# Patient Record
Sex: Female | Born: 1970 | Race: Black or African American | Hispanic: No | Marital: Single | Smoking: Never smoker
Health system: Southern US, Community
[De-identification: ages and names within clinical notes are randomized; demographics above are authoritative.]

## PROBLEM LIST (undated history)

## (undated) DIAGNOSIS — I10 Essential (primary) hypertension: Secondary | ICD-10-CM

## (undated) DIAGNOSIS — E785 Hyperlipidemia, unspecified: Secondary | ICD-10-CM

## (undated) DIAGNOSIS — E119 Type 2 diabetes mellitus without complications: Secondary | ICD-10-CM

## (undated) HISTORY — DX: Hyperlipidemia, unspecified: E78.5

## (undated) HISTORY — PX: APPENDECTOMY: SHX54

## (undated) HISTORY — DX: Essential (primary) hypertension: I10

## (undated) HISTORY — DX: Type 2 diabetes mellitus without complications: E11.9

---

## 1998-06-05 ENCOUNTER — Other Ambulatory Visit: Admission: RE | Admit: 1998-06-05 | Discharge: 1998-06-05 | Payer: Self-pay | Admitting: Obstetrics and Gynecology

## 1998-07-31 ENCOUNTER — Ambulatory Visit (HOSPITAL_COMMUNITY): Admission: RE | Admit: 1998-07-31 | Discharge: 1998-07-31 | Payer: Self-pay | Admitting: Obstetrics and Gynecology

## 1998-09-22 ENCOUNTER — Ambulatory Visit (HOSPITAL_COMMUNITY): Admission: RE | Admit: 1998-09-22 | Discharge: 1998-09-22 | Payer: Self-pay | Admitting: Obstetrics and Gynecology

## 1998-11-23 ENCOUNTER — Inpatient Hospital Stay (HOSPITAL_COMMUNITY): Admission: AD | Admit: 1998-11-23 | Discharge: 1998-11-23 | Payer: Self-pay | Admitting: Obstetrics and Gynecology

## 1998-11-29 ENCOUNTER — Inpatient Hospital Stay (HOSPITAL_COMMUNITY): Admission: AD | Admit: 1998-11-29 | Discharge: 1998-11-29 | Payer: Self-pay | Admitting: Obstetrics & Gynecology

## 1998-12-02 ENCOUNTER — Inpatient Hospital Stay (HOSPITAL_COMMUNITY): Admission: AD | Admit: 1998-12-02 | Discharge: 1998-12-04 | Payer: Self-pay | Admitting: Obstetrics and Gynecology

## 1998-12-30 ENCOUNTER — Other Ambulatory Visit: Admission: RE | Admit: 1998-12-30 | Discharge: 1998-12-30 | Payer: Self-pay | Admitting: Obstetrics & Gynecology

## 1999-02-26 ENCOUNTER — Ambulatory Visit (HOSPITAL_COMMUNITY): Admission: RE | Admit: 1999-02-26 | Discharge: 1999-02-26 | Payer: Self-pay | Admitting: Obstetrics & Gynecology

## 2002-11-07 ENCOUNTER — Other Ambulatory Visit: Admission: RE | Admit: 2002-11-07 | Discharge: 2002-11-07 | Payer: Self-pay | Admitting: Obstetrics & Gynecology

## 2006-07-26 ENCOUNTER — Encounter (INDEPENDENT_AMBULATORY_CARE_PROVIDER_SITE_OTHER): Payer: Self-pay | Admitting: *Deleted

## 2006-08-02 ENCOUNTER — Inpatient Hospital Stay (HOSPITAL_COMMUNITY): Admission: EM | Admit: 2006-08-02 | Discharge: 2006-08-09 | Payer: Self-pay | Admitting: Internal Medicine

## 2006-08-02 ENCOUNTER — Encounter: Payer: Self-pay | Admitting: Family Medicine

## 2006-08-16 ENCOUNTER — Ambulatory Visit (HOSPITAL_COMMUNITY): Admission: RE | Admit: 2006-08-16 | Discharge: 2006-08-16 | Payer: Self-pay | Admitting: Internal Medicine

## 2006-08-16 ENCOUNTER — Ambulatory Visit: Payer: Self-pay | Admitting: Family Medicine

## 2006-08-16 ENCOUNTER — Ambulatory Visit: Payer: Self-pay | Admitting: Internal Medicine

## 2006-08-16 ENCOUNTER — Inpatient Hospital Stay (HOSPITAL_COMMUNITY): Admission: EM | Admit: 2006-08-16 | Discharge: 2006-08-17 | Payer: Self-pay | Admitting: Emergency Medicine

## 2006-08-24 ENCOUNTER — Ambulatory Visit: Payer: Self-pay | Admitting: Family Medicine

## 2006-10-11 ENCOUNTER — Ambulatory Visit (HOSPITAL_COMMUNITY): Admission: RE | Admit: 2006-10-11 | Discharge: 2006-10-12 | Payer: Self-pay | Admitting: General Surgery

## 2007-02-22 DIAGNOSIS — E0590 Thyrotoxicosis, unspecified without thyrotoxic crisis or storm: Secondary | ICD-10-CM | POA: Insufficient documentation

## 2007-02-23 ENCOUNTER — Encounter (INDEPENDENT_AMBULATORY_CARE_PROVIDER_SITE_OTHER): Payer: Self-pay | Admitting: *Deleted

## 2007-02-28 IMAGING — CT CT PELVIS W/ CM
2 of 5 series · 17 of 46 positions shown, 19 images · IV contrast (omnipaque)
Comparison: CT 08/02/06.

CLINICAL DATA: Follow-up pelvic abscess.  
 ABDOMEN CT WITH CONTRAST:
TECHNIQUE: Multidetector CT imaging of the abdomen was performed following the standard protocol during bolus administration of intravenous contrast.
 Contrast:  100 cc Omnipaque 300.
TECHNIQUE: Multidetector CT imaging of the pelvis was performed following the standard protocol during bolus administration of intravenous contrast.

[Series 2: abd/pelv with 5.0 b31f st · axial · 0.80mm/px · z∈[-458,-58]mm · 14 of 92 slices shown, 16 images]
[im 6/92  soft-tissue]
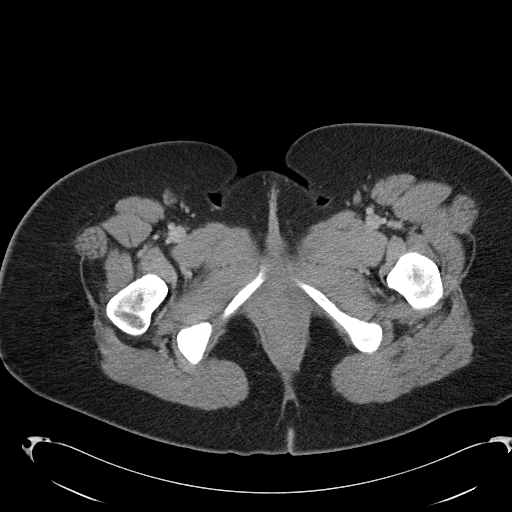
[im 6/92  bone]
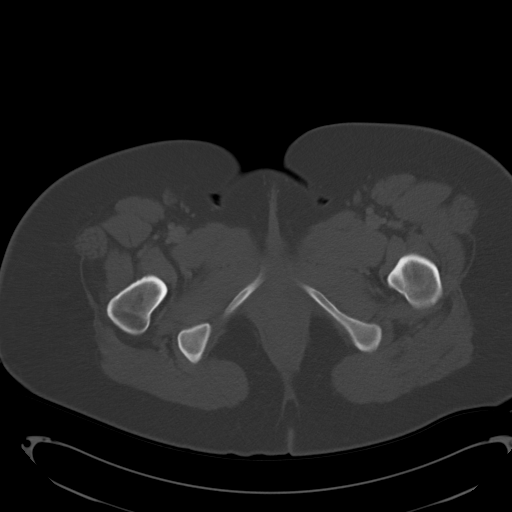
[im 11/92  soft-tissue]
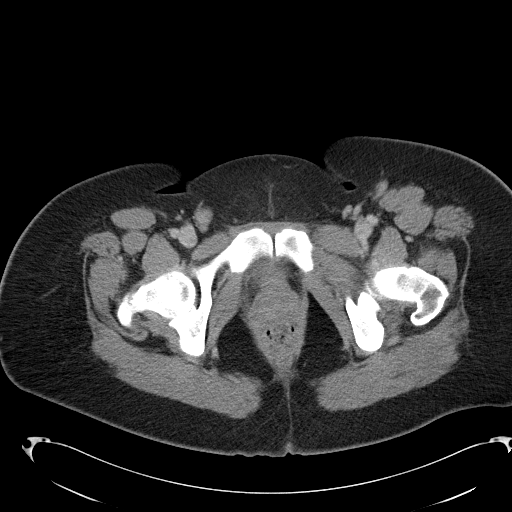
[im 21/92  soft-tissue]
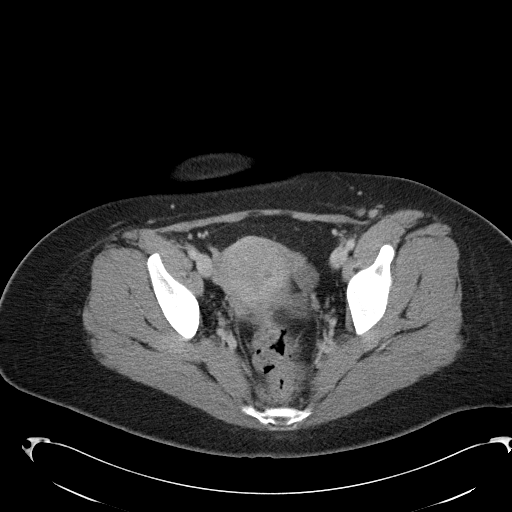
[im 26/92  soft-tissue]
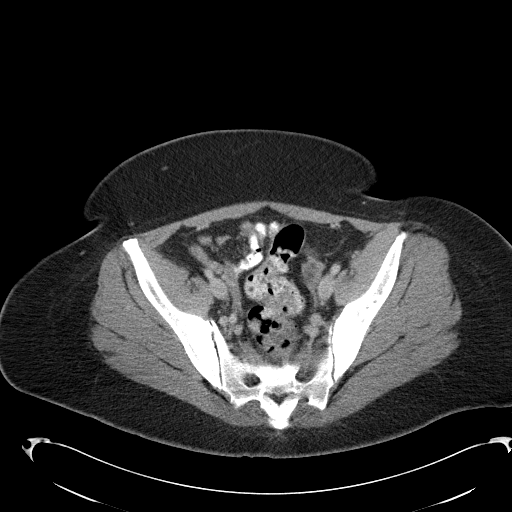
[im 31/92  soft-tissue]
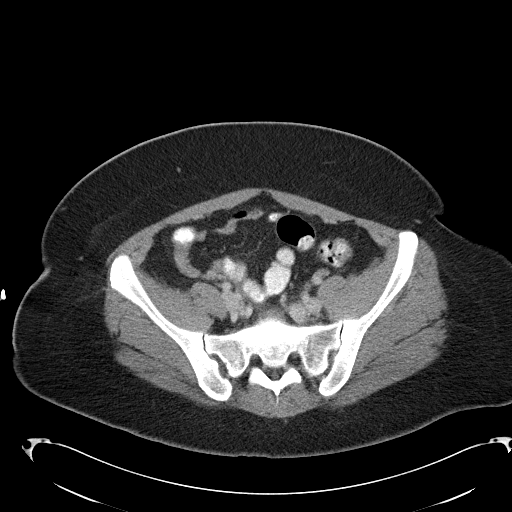
[im 36/92  soft-tissue]
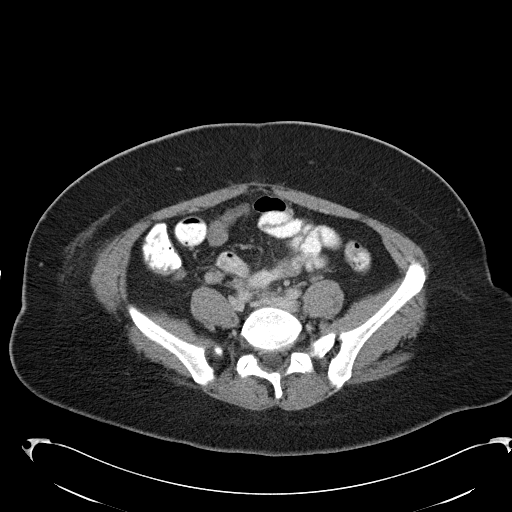
[im 41/92  soft-tissue]
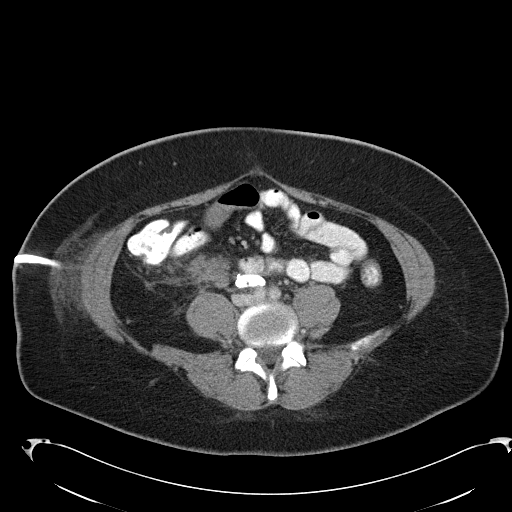
[im 51/92  soft-tissue]
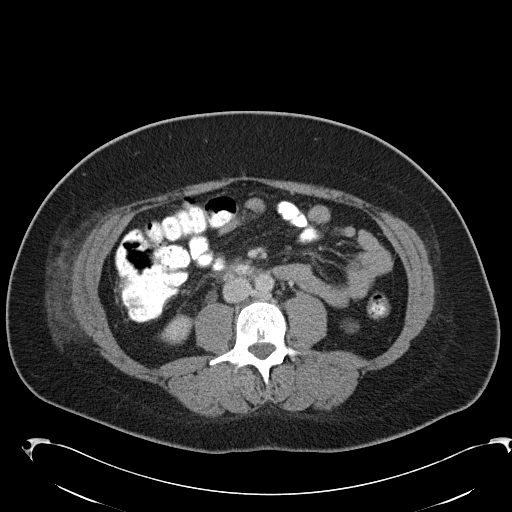
[im 56/92  soft-tissue]
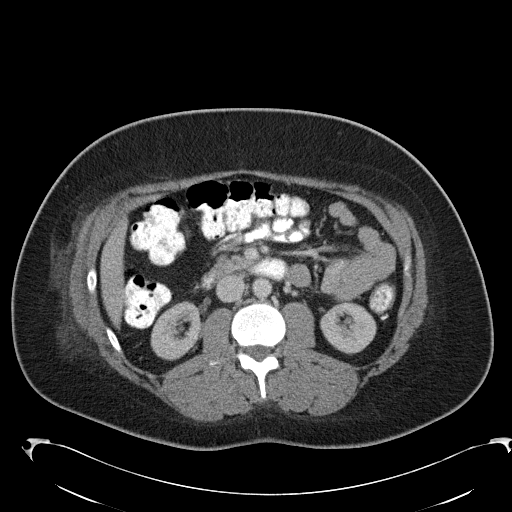
[im 56/92  bone]
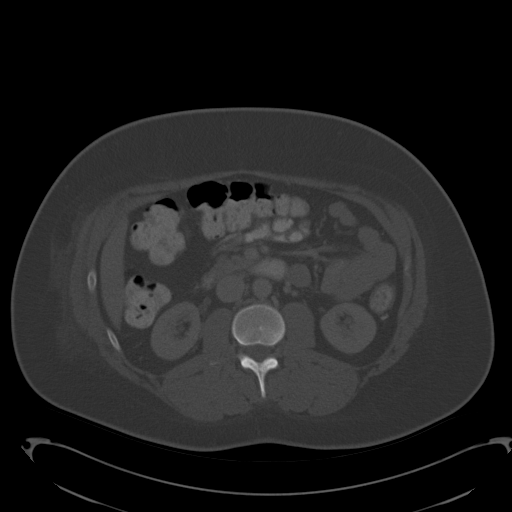
[im 61/92  soft-tissue]
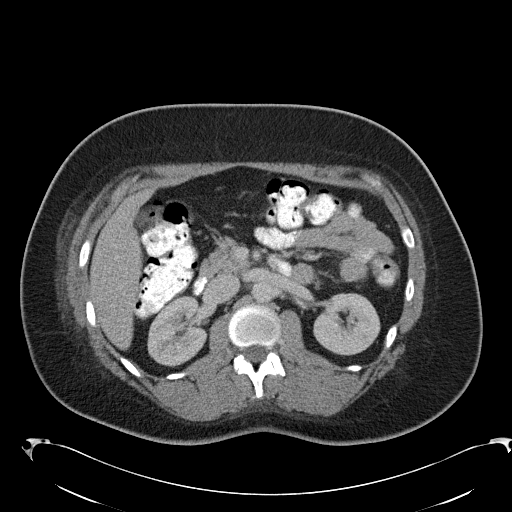
[im 66/92  soft-tissue]
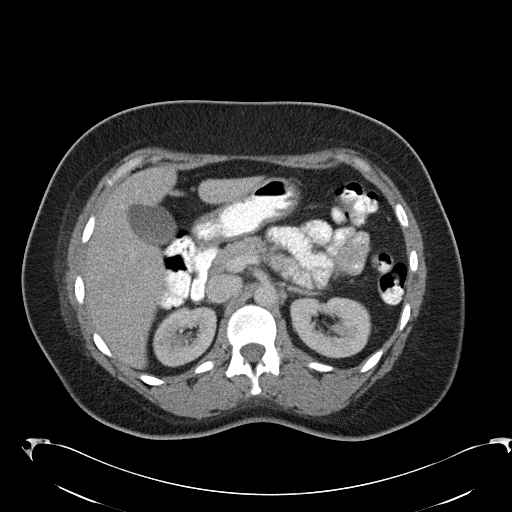
[im 71/92  soft-tissue]
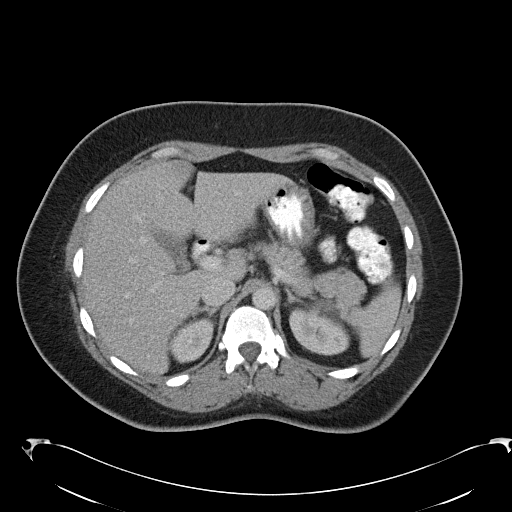
[im 81/92  soft-tissue]
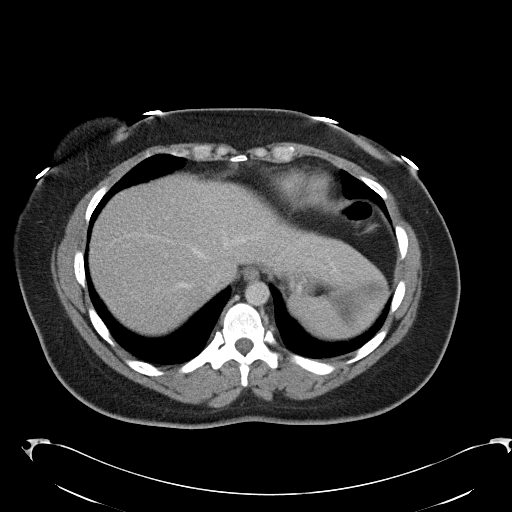
[im 86/92  soft-tissue]
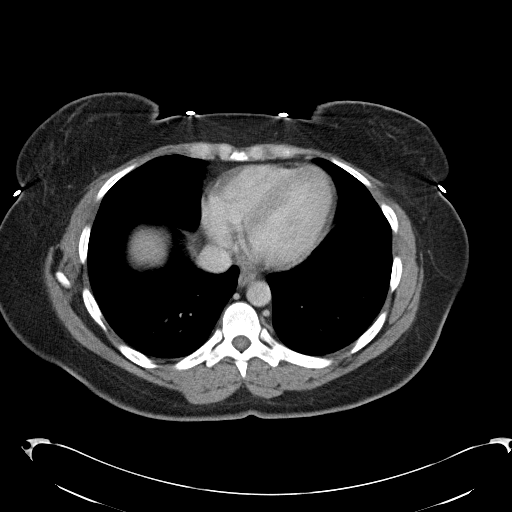

[Series 603: coronal · coronal · 0.89mm/px · 3 of 112 slices shown]
[im 38/112  soft-tissue]
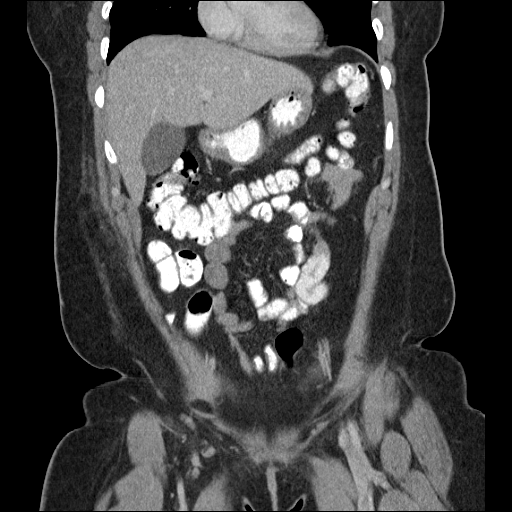
[im 50/112  soft-tissue]
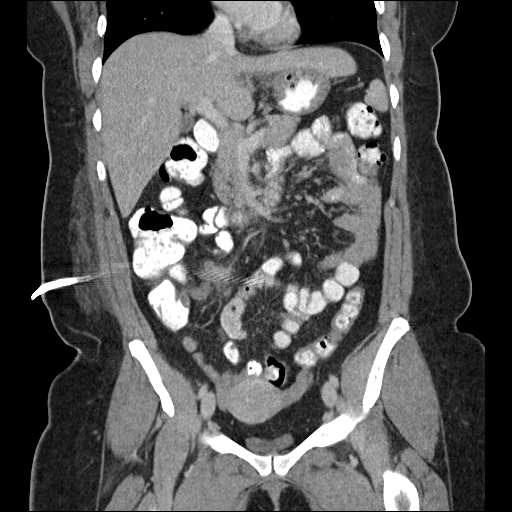
[im 62/112  soft-tissue]
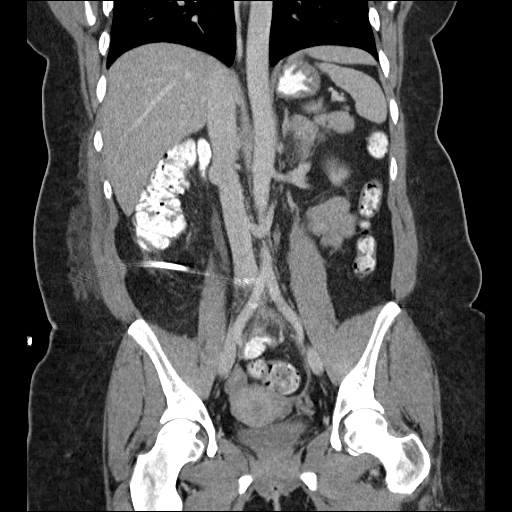

[17 of 46 positions shown; findings below may reference images not displayed]

FINDINGS: The liver, spleen, pancreas, and kidneys are normal.  The bowel is not dilated.  There is no free fluid.
IMPRESSION: Negative. 
 PELVIS CT WITH CONTRAST:
FINDINGS: There is a pigtail catheter drain in place from a right flank approach.  The tip is anterior to the distal aorta and proximal IVC in the retroperitoneum.  There is some inflammatory thickening around the catheter tubing with a soft tissue density measuring approximately 2 X 3 cm.  This is significantly improved from the pre-drainage CT when there was a 7 cm abscess.  The appendix is mildly thickened but does contain some air.  The appendix measures approximately 8 mm in diameter.  There is no free fluid.  There is a 15 mm cyst on the left ovary.  The bowel is nondilated.  There is mild diverticulosis in the left colon without acute inflammation of the colon.
IMPRESSION: Satisfactory catheter placement.  There is some soft tissue thickening around the catheter compatible with some residual inflammation without a definite drainable fluid collection.  The appendix is mildly thickened and there is some mildly periappendiceal edema.  
 Dr. Blain Jumper is attempting to reach Dr. Biffi regarding potential removal of the drainage catheter and surgical consultation for appendectomy.

## 2007-02-28 IMAGING — CR DG CHEST 1V PORT
1 series · 1 of 1 positions shown · non-contrast
Comparison: none

CLINICAL DATA: Syncope.
 PORTABLE CHEST ? 1 VIEW ? 08/16/06 ? 5835 HOURS:

[view not recorded]
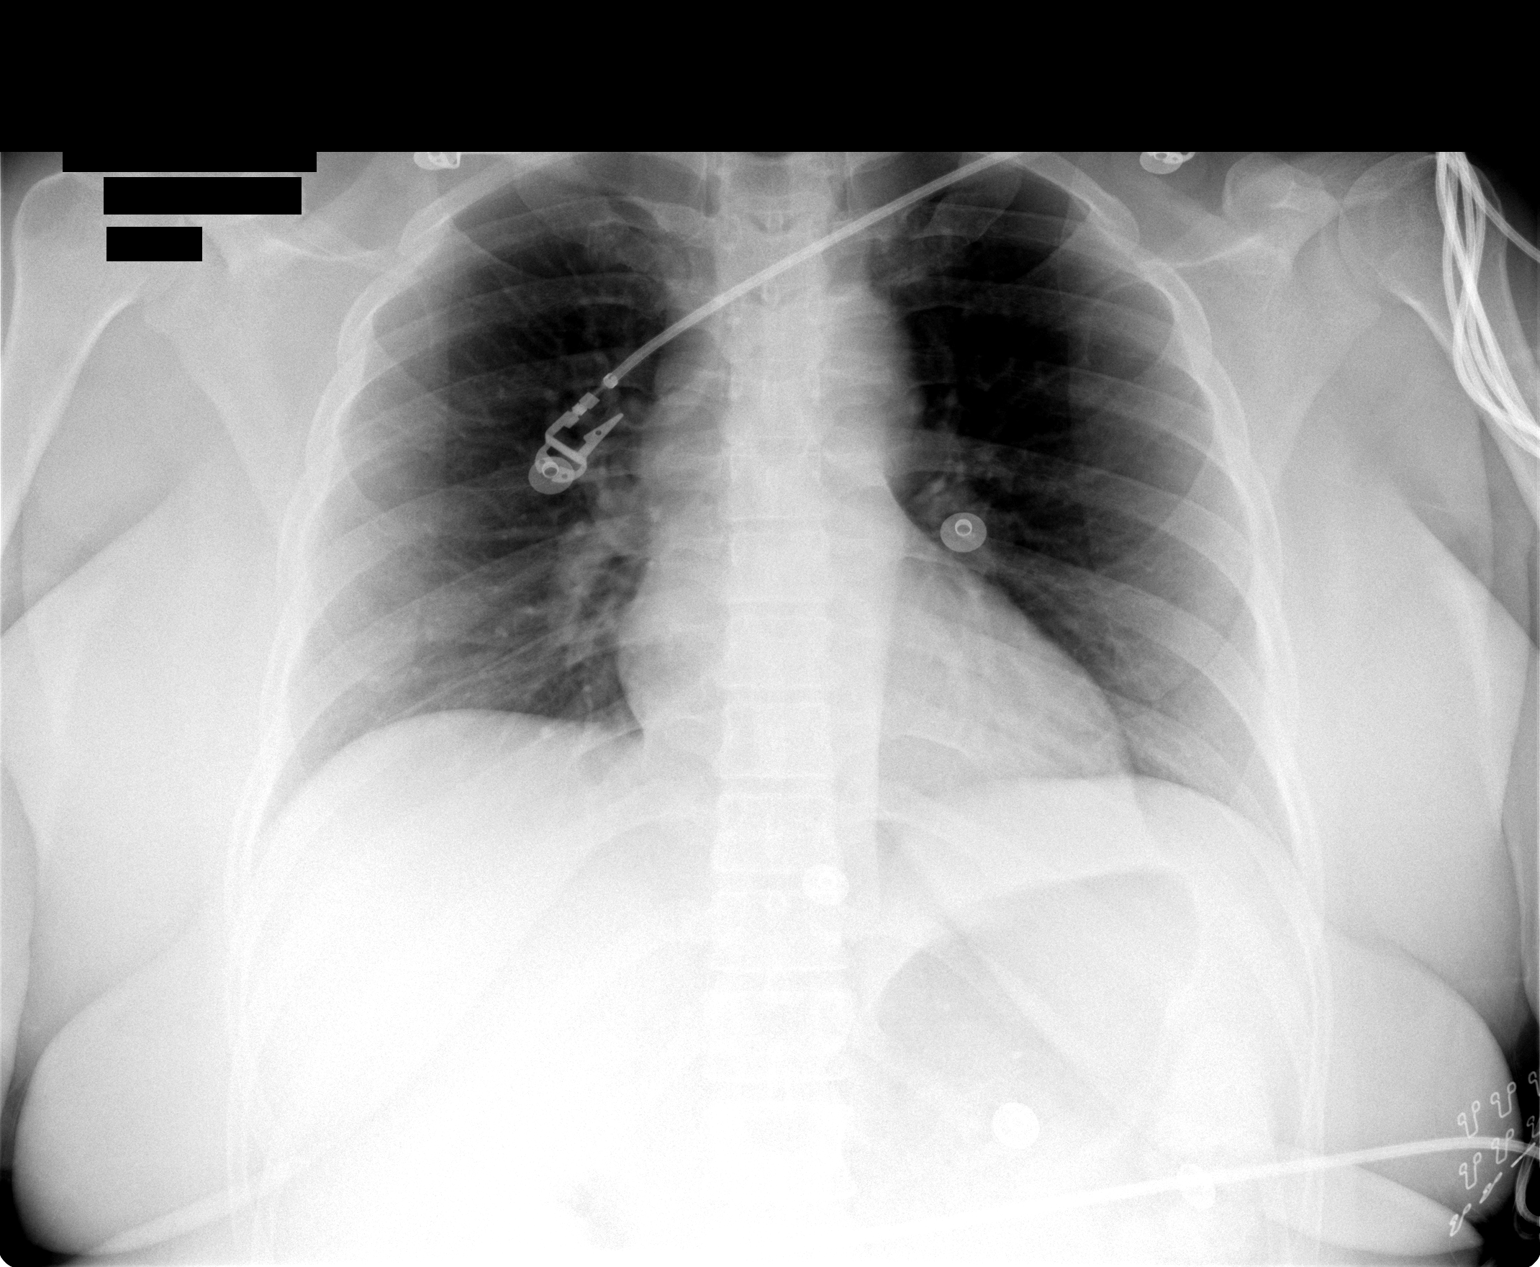

[1 of 1 positions shown; findings below may reference images not displayed]

FINDINGS: The lungs are clear.  The heart is within normal limits of size.  No bony abnormality is seen.
IMPRESSION: No active lung disease.

## 2011-01-16 ENCOUNTER — Encounter: Payer: Self-pay | Admitting: Internal Medicine

## 2013-03-20 ENCOUNTER — Ambulatory Visit (INDEPENDENT_AMBULATORY_CARE_PROVIDER_SITE_OTHER): Payer: BC Managed Care – PPO | Admitting: Family Medicine

## 2013-03-20 VITALS — BP 170/110 | HR 96 | Temp 98.0°F | Resp 18 | Ht 63.5 in | Wt 226.0 lb

## 2013-03-20 DIAGNOSIS — R030 Elevated blood-pressure reading, without diagnosis of hypertension: Secondary | ICD-10-CM

## 2013-03-20 DIAGNOSIS — J42 Unspecified chronic bronchitis: Secondary | ICD-10-CM

## 2013-03-20 DIAGNOSIS — IMO0001 Reserved for inherently not codable concepts without codable children: Secondary | ICD-10-CM

## 2013-03-20 MED ORDER — CEFTRIAXONE SODIUM 1 G IJ SOLR
1.0000 g | Freq: Once | INTRAMUSCULAR | Status: AC
  Administered 2013-03-20: 1 g via INTRAMUSCULAR

## 2013-03-20 MED ORDER — AZITHROMYCIN 500 MG PO TABS: 500.0000 mg | ORAL_TABLET | Freq: Every day | ORAL | Status: DC

## 2013-03-20 NOTE — Progress Notes (Signed)
SUBJECTIVE:  Kristine Mcdonald is a 42 y.o. female who complains of congestion, sneezing, nasal blockage, post nasal drip, dry cough and hoarseness for 21 days. She denies a history of chest pain, dizziness, fevers, myalgias, shortness of breath and weight loss and denies a history of asthma. Patient denies smoke cigarettes. 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    OBJECTIVE: Blood pressure 170/110, pulse 96, temperature 98 F (36.7 C), temperature source Oral, resp. rate 18, height 5' 3.5" (1.613 m), weight 226 lb (102.513 kg), last menstrual period 03/20/2013, SpO2 100.00%.  She appears well, vital signs are as noted. Ears normal.  Throat and pharynx mild erythema.  Neck supple. Mild adenopathy in the neck. Nose is congested. Sinuses non tender. The chest is clear, without wheezes or rales some upper airway transmitted noises.  ASSESSMENT:  Bronchitis  Elevated blood pressure.   PLAN: Symptomatic therapy suggested: push fluids, rest, gargle warm salt water and return office visit prn if symptoms persist or worsen. Ceftriaxone and azithro given today.. Call or return to clinic prn if these symptoms worsen or fail to improve as anticipated.  Discussed with patient at length about elevated blood pressure and how dangerous it can be.  Patient did take cold medication OTC that could be contributing.  Discussed for patient to go to drug store 2 times a week and check blood pressure keeping a diary and return in 2 weeks to recheck.

## 2013-03-20 NOTE — Patient Instructions (Signed)
Very nice to meet you.  I am giving you a medicine called azithromycin.  Take 1 pill daily for 3 days.  If not better in 1 week or get a fever please come back Come back in 2 weeks to have blood pressure check.   Bronchitis Bronchitis is the body's way of reacting to injury and/or infection (inflammation) of the bronchi. Bronchi are the air tubes that extend from the windpipe into the lungs. If the inflammation becomes severe, it may cause shortness of breath. CAUSES  Inflammation may be caused by:  A virus.  Germs (bacteria).  Dust.  Allergens.  Pollutants and many other irritants. The cells lining the bronchial tree are covered with tiny hairs (cilia). These constantly beat upward, away from the lungs, toward the mouth. This keeps the lungs free of pollutants. When these cells become too irritated and are unable to do their job, mucus begins to develop. This causes the characteristic cough of bronchitis. The cough clears the lungs when the cilia are unable to do their job. Without either of these protective mechanisms, the mucus would settle in the lungs. Then you would develop pneumonia. Smoking is a common cause of bronchitis and can contribute to pneumonia. Stopping this habit is the single most important thing you can do to help yourself. TREATMENT   Your caregiver may prescribe an antibiotic if the cough is caused by bacteria. Also, medicines that open up your airways make it easier to breathe. Your caregiver may also recommend or prescribe an expectorant. It will loosen the mucus to be coughed up. Only take over-the-counter or prescription medicines for pain, discomfort, or fever as directed by your caregiver.  Removing whatever causes the problem (smoking, for example) is critical to preventing the problem from getting worse.  Cough suppressants may be prescribed for relief of cough symptoms.  Inhaled medicines may be prescribed to help with symptoms now and to help prevent  problems from returning.  For those with recurrent (chronic) bronchitis, there may be a need for steroid medicines. SEEK IMMEDIATE MEDICAL CARE IF:   During treatment, you develop more pus-like mucus (purulent sputum).  You have a fever.  Your baby is older than 3 months with a rectal temperature of 102 F (38.9 C) or higher.  Your baby is 75 months old or younger with a rectal temperature of 100.4 F (38 C) or higher.  You become progressively more ill.  You have increased difficulty breathing, wheezing, or shortness of breath. It is necessary to seek immediate medical care if you are elderly or sick from any other disease. MAKE SURE YOU:   Understand these instructions.  Will watch your condition.  Will get help right away if you are not doing well or get worse. Document Released: 12/12/2005 Document Revised: 03/05/2012 Document Reviewed: 10/21/2008 [facility] Patient Information 2013 [place], [place].

## 2013-03-20 NOTE — Addendum Note (Signed)
Addended by: Judi Saa on: 03/20/2013 09:09 PM   Modules accepted: Orders

## 2013-04-10 ENCOUNTER — Ambulatory Visit (INDEPENDENT_AMBULATORY_CARE_PROVIDER_SITE_OTHER): Payer: BC Managed Care – PPO | Admitting: Emergency Medicine

## 2013-04-10 VITALS — BP 195/103 | HR 80 | Temp 99.3°F | Resp 18 | Wt 229.0 lb

## 2013-04-10 DIAGNOSIS — I10 Essential (primary) hypertension: Secondary | ICD-10-CM

## 2013-04-10 MED ORDER — HYDROCHLOROTHIAZIDE 25 MG PO TABS: 25.0000 mg | ORAL_TABLET | Freq: Every day | ORAL | Status: DC

## 2013-04-10 MED ORDER — LISINOPRIL 20 MG PO TABS: 20.0000 mg | ORAL_TABLET | Freq: Every day | ORAL | Status: DC

## 2013-04-10 NOTE — Patient Instructions (Addendum)

## 2013-04-10 NOTE — Progress Notes (Signed)
[facility] [address], [place] [address] [phone]  Date:  04/10/2013   Name:  Kristine Mcdonald   DOB:  09-Nov-1971   MRN:  [id number]  PCP:  Nilda Simmer, MD    Chief Complaint: Hypertension and Follow-up   History of Present Illness:  Kristine Mcdonald is a 42 y.o. very pleasant female patient who presents with the following:  Seen a couple weeks ago with URI and found to have elevated blood pressure that has been sustained with home readings during the interval.  Denies symptoms of end organ damage.  No history of hypertension and never treated.  Family history of hypertension.  No improvement with over the counter medications or other home remedies. Denies other complaint or health concern today.  Non smoker   Patient Active Problem List  Diagnosis  . HYPERTHYROIDISM, NOS    No past medical history on file.  Past Surgical History  Procedure Laterality Date  . Appendectomy      History  Substance Use Topics  . Smoking status: Never Smoker   . Smokeless tobacco: Not on file  . Alcohol Use: No    Family History  Problem Relation Age of Onset  . Hypertension Mother     Allergies  Allergen Reactions  . Iohexol      Code: HIVES, Desc: hives/rash/leg & arm pain a few hours after iv contrast administration- pt admitted- benadryl & solu-medrol given- 13-hr pre-meds required for future iv contrast administration, Onset Date: [id number]     Medication list has been reviewed and updated.  No current outpatient prescriptions on file prior to visit.   No current facility-administered medications on file prior to visit.    Review of Systems:  As per HPI, otherwise negative.    Physical Examination: Filed Vitals:   04/10/13 1902  BP: 195/103  Pulse: 80  Temp: 99.3 F (37.4 C)  Resp: 18   Filed Vitals:   04/10/13 1902  Weight: 229 lb (103.874 kg)   Body mass index is 39.92 kg/(m^2). Ideal Body Weight:    GEN: WDWN, NAD,  Non-toxic, A & O x 3 HEENT: Atraumatic, Normocephalic. Neck supple. No masses, No LAD. Ears and Nose: No external deformity. CV: RRR, No M/G/R. No JVD. No thrill. No extra heart sounds. PULM: CTA B, no wheezes, crackles, rhonchi. No retractions. No resp. distress. No accessory muscle use. ABD: S, NT, ND, +BS. No rebound. No HSM. EXTR: No c/c/e NEURO Normal gait.  PSYCH: Normally interactive. Conversant. Not depressed or anxious appearing.  Calm demeanor.    Assessment and Plan: Hypertension Lisinopril HCTZ Follow up in two weeks fasting in 104  Signed,  Phillips Odor, MD

## 2013-08-09 ENCOUNTER — Other Ambulatory Visit: Payer: Self-pay | Admitting: Emergency Medicine

## 2013-08-14 ENCOUNTER — Telehealth: Payer: Self-pay

## 2013-08-14 MED ORDER — HYDROCHLOROTHIAZIDE 25 MG PO TABS: 25.0000 mg | ORAL_TABLET | Freq: Every day | ORAL | Status: DC

## 2013-08-14 MED ORDER — LISINOPRIL 20 MG PO TABS: ORAL_TABLET | ORAL | Status: DC

## 2013-08-14 NOTE — Telephone Encounter (Signed)
30-day supply sent. Needs OV for additional refills.  Meds ordered this encounter  Medications  . hydrochlorothiazide (HYDRODIURIL) 25 MG tablet    Sig: Take 1 tablet (25 mg total) by mouth daily.    Dispense:  30 tablet    Refill:  0    Order Specific Question:  Supervising Provider    Answer:  DOOLITTLE, ROBERT P [[id number]]  . lisinopril (PRINIVIL,ZESTRIL) 20 MG tablet    Sig: TAKE 1 TABLET (20 MG TOTAL) BY MOUTH DAILY.    Dispense:  30 tablet    Refill:  0    Order Specific Question:  Supervising Provider    Answer:  Tonye Pearson [[id number]]  '

## 2013-08-14 NOTE — Telephone Encounter (Signed)
Patient is calling to get a refill on her blood pressure meds she know she needs an office visit but one like one more refill please call patient at [phone]

## 2013-08-14 NOTE — Telephone Encounter (Signed)
Called pt to advise Rx's sent in. LMOM to let her know.

## 2013-09-15 ENCOUNTER — Ambulatory Visit (INDEPENDENT_AMBULATORY_CARE_PROVIDER_SITE_OTHER): Payer: BC Managed Care – PPO | Admitting: Internal Medicine

## 2013-09-15 VITALS — BP 110/70 | HR 96 | Temp 98.0°F | Resp 16 | Ht 63.5 in | Wt 228.0 lb

## 2013-09-15 DIAGNOSIS — Z79899 Other long term (current) drug therapy: Secondary | ICD-10-CM

## 2013-09-15 DIAGNOSIS — I10 Essential (primary) hypertension: Secondary | ICD-10-CM

## 2013-09-15 DIAGNOSIS — Z7189 Other specified counseling: Secondary | ICD-10-CM

## 2013-09-15 LAB — POCT URINALYSIS DIPSTICK
Glucose, UA: NEGATIVE
Leukocytes, UA: NEGATIVE
Nitrite, UA: NEGATIVE
Protein, UA: NEGATIVE
Spec Grav, UA: 1.02
Urobilinogen, UA: 0.2

## 2013-09-15 LAB — POCT CBC
Granulocyte percent: 65.9 %G (ref 37–80)
MCH, POC: 24.4 pg — AB (ref 27–31.2)
MCV: 79 fL — AB (ref 80–97)
MID (cbc): 0.4 (ref 0–0.9)
MPV: 7.3 fL (ref 0–99.8)
POC LYMPH PERCENT: 28.8 %L (ref 10–50)
POC MID %: 5.3 %M (ref 0–12)
Platelet Count, POC: 498 10*3/uL — AB (ref 142–424)
RBC: 4.06 M/uL (ref 4.04–5.48)
RDW, POC: 15.5 %
WBC: 8.2 10*3/uL (ref 4.6–10.2)

## 2013-09-15 LAB — COMPREHENSIVE METABOLIC PANEL
Albumin: 3.9 g/dL (ref 3.5–5.2)
BUN: 17 mg/dL (ref 6–23)
CO2: 25 mEq/L (ref 19–32)
Calcium: 9.2 mg/dL (ref 8.4–10.5)
Chloride: 102 mEq/L (ref 96–112)
Creat: 1.46 mg/dL — ABNORMAL HIGH (ref 0.50–1.10)
Glucose, Bld: 81 mg/dL (ref 70–99)
Potassium: 4.4 mEq/L (ref 3.5–5.3)

## 2013-09-15 LAB — POCT UA - MICROSCOPIC ONLY
Bacteria, U Microscopic: NEGATIVE
Casts, Ur, LPF, POC: NEGATIVE
Crystals, Ur, HPF, POC: NEGATIVE

## 2013-09-15 LAB — LIPID PANEL
Cholesterol: 164 mg/dL (ref 0–200)
HDL: 45 mg/dL (ref >39)
Total CHOL/HDL Ratio: 3.6 Ratio

## 2013-09-15 MED ORDER — LISINOPRIL 20 MG PO TABS: ORAL_TABLET | ORAL | Status: DC

## 2013-09-15 NOTE — Progress Notes (Signed)
  Subjective:    Patient ID: Kristine Mcdonald, female    DOB: 1971/01/05, 42 y.o.   MRN: [id number]  HPI Pt presents for medication refills on Lisinopril and HCTZ. No problems and BP well controlled.  Review of Systems neg     Objective:   Physical Exam  Vitals reviewed. Constitutional: She is oriented to person, place, and time. She appears well-developed and well-nourished. No distress.  HENT:  Head: Normocephalic.  Eyes: EOM are normal.  Neck: Normal range of motion.  Cardiovascular: Normal rate, regular rhythm and normal heart sounds.   Pulmonary/Chest: Effort normal and breath sounds normal.  Musculoskeletal: Normal range of motion.  Neurological: She is alert and oriented to person, place, and time. She exhibits normal muscle tone. Coordination normal.  Psychiatric: She has a normal mood and affect.    labs Results for orders placed in visit on 09/15/13  POCT CBC      Result Value Range   WBC 8.2  4.6 - 10.2 K/uL   Lymph, poc 2.4  0.6 - 3.4   POC LYMPH PERCENT 28.8  10 - 50 %L   MID (cbc) 0.4  0 - 0.9   POC MID % 5.3  0 - 12 %M   POC Granulocyte 5.4  2 - 6.9   Granulocyte percent 65.9  37 - 80 %G   RBC 4.06  4.04 - 5.48 M/uL   Hemoglobin 9.9 (*) 12.2 - 16.2 g/dL   HCT, POC 62.1 (*) 30.8 - 47.9 %   MCV 79.0 (*) 80 - 97 fL   MCH, POC 24.4 (*) 27 - 31.2 pg   MCHC 30.8 (*) 31.8 - 35.4 g/dL   RDW, POC 65.7     Platelet Count, POC 498 (*) 142 - 424 K/uL   MPV 7.3  0 - 99.8 fL  POCT UA - MICROSCOPIC ONLY      Result Value Range   WBC, Ur, HPF, POC neg     RBC, urine, microscopic neg     Bacteria, U Microscopic neg     Mucus, UA neg     Epithelial cells, urine per micros TNTC     Crystals, Ur, HPF, POC neg     Casts, Ur, LPF, POC neg     Yeast, UA neg    POCT URINALYSIS DIPSTICK      Result Value Range   Color, UA yellow     Clarity, UA clear     Glucose, UA neg     Bilirubin, UA neg     Ketones, UA neg     Spec Grav, UA 1.020     Blood, UA neg     pH, UA  7.0     Protein, UA neg     Urobilinogen, UA 0.2     Nitrite, UA neg     Leukocytes, UA Negative          Assessment & Plan:  HTN controlled Anemia--take vitamin with iron

## 2013-09-15 NOTE — Patient Instructions (Addendum)
DASH Diet The DASH diet stands for "Dietary Approaches to Stop Hypertension." It is a healthy eating plan that has been shown to reduce high blood pressure (hypertension) in as little as 14 days, while also possibly providing other significant health benefits. These other health benefits include reducing the risk of breast cancer after menopause and reducing the risk of type 2 diabetes, heart disease, colon cancer, and stroke. Health benefits also include weight loss and slowing kidney failure in patients with chronic kidney disease.  DIET GUIDELINES  Limit salt (sodium). Your diet should contain less than 1500 mg of sodium daily.  Limit refined or processed carbohydrates. Your diet should include mostly whole grains. Desserts and added sugars should be used sparingly.  Include small amounts of heart-healthy fats. These types of fats include nuts, oils, and tub margarine. Limit saturated and trans fats. These fats have been shown to be harmful in the body. CHOOSING FOODS  The following food groups are based on a 2000 calorie diet. See your Registered Dietitian for individual calorie needs. Grains and Grain Products (6 to 8 servings daily)  Eat More Often: Whole-wheat bread, brown rice, whole-grain or wheat pasta, quinoa, popcorn without added fat or salt (air popped).  Eat Less Often: White bread, white pasta, white rice, cornbread. Vegetables (4 to 5 servings daily)  Eat More Often: Fresh, frozen, and canned vegetables. Vegetables may be raw, steamed, roasted, or grilled with a minimal amount of fat.  Eat Less Often/Avoid: Creamed or fried vegetables. Vegetables in a cheese sauce. Fruit (4 to 5 servings daily)  Eat More Often: All fresh, canned (in natural juice), or frozen fruits. Dried fruits without added sugar. One hundred percent fruit juice ( cup [237 mL] daily).  Eat Less Often: Dried fruits with added sugar. Canned fruit in light or heavy syrup. Lean Meats, Fish, and Poultry (2  servings or less daily. One serving is 3 to 4 oz [85-114 g]).  Eat More Often: Ninety percent or leaner ground beef, tenderloin, sirloin. Round cuts of beef, chicken breast, turkey breast. All fish. Grill, bake, or broil your meat. Nothing should be fried.  Eat Less Often/Avoid: Fatty cuts of meat, turkey, or chicken leg, thigh, or wing. Fried cuts of meat or fish. Dairy (2 to 3 servings)  Eat More Often: Low-fat or fat-free milk, low-fat plain or light yogurt, reduced-fat or part-skim cheese.  Eat Less Often/Avoid: Milk (whole, 2%).Whole milk yogurt. Full-fat cheeses. Nuts, Seeds, and Legumes (4 to 5 servings per week)  Eat More Often: All without added salt.  Eat Less Often/Avoid: Salted nuts and seeds, canned beans with added salt. Fats and Sweets (limited)  Eat More Often: Vegetable oils, tub margarines without trans fats, sugar-free gelatin. Mayonnaise and salad dressings.  Eat Less Often/Avoid: Coconut oils, palm oils, butter, stick margarine, cream, half and half, cookies, candy, pie. FOR MORE INFORMATION The Dash Diet Eating Plan: www.dashdiet.org Document Released: 12/01/2011 Document Revised: 03/05/2012 Document Reviewed: 12/01/2011 [employer] Patient Information 2014 [employer]. Calorie Counting Diet A calorie counting diet requires you to eat the number of calories that are right for you in a day. Calories are the measurement of how much energy you get from the food you eat. Eating the right amount of calories is important for staying at a healthy weight. If you eat too many calories, your body will store them as fat and you may gain weight. If you eat too few calories, you may lose weight. Counting the number of calories you eat during   a day will help you know if you are eating the right amount. A Registered Dietitian can determine how many calories you need in a day. The amount of calories needed varies from person to person. If your goal is to lose weight, you will need  to eat fewer calories. Losing weight can benefit you if you are overweight or have health problems such as heart disease, high blood pressure, or diabetes. If your goal is to gain weight, you will need to eat more calories. Gaining weight may be necessary if you have a certain health problem that causes your body to need more energy. TIPS Whether you are increasing or decreasing the number of calories you eat during a day, it may be hard to get used to changes in what you eat and drink. The following are tips to help you keep track of the number of calories you eat.  Measure foods at home with measuring cups. This helps you know the amount of food and number of calories you are eating.  Restaurants often serve food in amounts that are larger than 1 serving. While eating out, estimate how many servings of a food you are given. For example, a serving of cooked rice is  cup or about the size of half of a fist. Knowing serving sizes will help you be aware of how much food you are eating at restaurants.  Ask for smaller portion sizes or child-size portions at restaurants.  Plan to eat half of a meal at a restaurant. Take the rest home or share the other half with a friend.  Read the Nutrition Facts panel on food labels for calorie content and serving size. You can find out how many servings are in a package, the size of a serving, and the number of calories each serving has.  For example, a package might contain 3 cookies. The Nutrition Facts panel on that package says that 1 serving is 1 cookie. Below that, it will say there are 3 servings in the container. The calories section of the Nutrition Facts label says there are 90 calories. This means there are 90 calories in 1 cookie (1 serving). If you eat 1 cookie you have eaten 90 calories. If you eat all 3 cookies, you have eaten 270 calories (3 servings x 90 calories = 270 calories). The list below tells you how big or small some common portion sizes  are.  1 oz.........4 stacked dice.  3 oz.........Deck of cards.  1 tsp........Tip of little finger.  1 tbs........Thumb.  2 tbs........Golf ball.   cup.......Half of a fist.  1 cup........A fist. KEEP A FOOD LOG Write down every food item you eat, the amount you eat, and the number of calories in each food you eat during the day. At the end of the day, you can add up the total number of calories you have eaten. It may help to keep a list like the one below. Find out the calorie information by reading the Nutrition Facts panel on food labels. Breakfast  Bran cereal (1 cup, 110 calories).  Fat-free milk ( cup, 45 calories). Snack  Apple (1 medium, 80 calories). Lunch  Spinach (1 cup, 20 calories).  Tomato ( medium, 20 calories).  Chicken breast strips (3 oz, 165 calories).  Shredded cheddar cheese ( cup, 110 calories).  Light Italian dressing (2 tbs, 60 calories).  Whole-wheat bread (1 slice, 80 calories).  Tub margarine (1 tsp, 35 calories).  Vegetable soup (1 cup, 160 calories). Dinner    Pork chop (3 oz, 190 calories).  Brown rice (1 cup, 215 calories).  Steamed broccoli ( cup, 20 calories).  Strawberries (1  cup, 65 calories).  Whipped cream (1 tbs, 50 calories). Daily Calorie Total: 1425 Document Released: 12/12/2005 Document Revised: 03/05/2012 Document Reviewed: 06/08/2007 [employer] Patient Information 2014 [employer].  

## 2013-09-15 NOTE — Progress Notes (Signed)
  Subjective:    Patient ID: Kristine Mcdonald, female    DOB: 1971/11/04, 42 y.o.   MRN: [id number]  HPI    Review of Systems     Objective:   Physical Exam        Assessment & Plan:

## 2013-09-16 ENCOUNTER — Telehealth: Payer: Self-pay

## 2013-09-16 NOTE — Telephone Encounter (Signed)
PT would like for someone to call her back about labs she missed a call earlier about them Call back number is [phone]

## 2013-09-17 ENCOUNTER — Telehealth: Payer: Self-pay

## 2013-09-17 NOTE — Telephone Encounter (Signed)
See labs, they have called again

## 2013-09-17 NOTE — Telephone Encounter (Signed)
Pt.notified

## 2013-09-17 NOTE — Telephone Encounter (Signed)
Pt was returning our call in regards to her labs.  [phone]

## 2014-05-25 ENCOUNTER — Other Ambulatory Visit: Payer: Self-pay | Admitting: Emergency Medicine

## 2014-07-14 ENCOUNTER — Other Ambulatory Visit: Payer: Self-pay | Admitting: Internal Medicine

## 2014-08-02 ENCOUNTER — Other Ambulatory Visit: Payer: Self-pay | Admitting: Internal Medicine

## 2014-08-02 NOTE — Telephone Encounter (Signed)
Pt has only been taking 1/2 tab qd and so hasn't needed RF's. She is coming in for a CPE after 9/21. Sent in a 2 month supply until she could get in. Pt agreeable.

## 2014-08-02 NOTE — Telephone Encounter (Signed)
Last CPE was on 09/15/13, last med RF was on 05/25/14. The request from 07/14/14 was denied. Called pt to check on f/u status.

## 2014-09-30 ENCOUNTER — Ambulatory Visit (INDEPENDENT_AMBULATORY_CARE_PROVIDER_SITE_OTHER): Payer: BC Managed Care – PPO | Admitting: Family Medicine

## 2014-09-30 VITALS — BP 110/80 | HR 94 | Temp 98.8°F | Resp 16 | Ht 64.0 in | Wt 235.0 lb

## 2014-09-30 DIAGNOSIS — I10 Essential (primary) hypertension: Secondary | ICD-10-CM

## 2014-09-30 DIAGNOSIS — Z23 Encounter for immunization: Secondary | ICD-10-CM

## 2014-09-30 MED ORDER — LISINOPRIL-HYDROCHLOROTHIAZIDE 20-12.5 MG PO TABS: 1.0000 | ORAL_TABLET | Freq: Every day | ORAL | Status: DC

## 2014-09-30 NOTE — Progress Notes (Signed)
Subjective: Patient feels fairly well. She had a cold about a month ago and is gradually getting over the cough. Even prior to being on blood pressure medicine she tends to have a little hacking cough. We talked about blood pressure medicine induced cough from the lisinopril. If she gets worse she will let me know, or if it is not improving. No major complaints today. She is trying to work on getting exercise and modifying eating behavior. We had a long talk about that.  Objective: Chest is clear. Heart regular without murmurs. No thyromegaly. Blood pressure is noted.  Assessment: Hypertension, controlled Overweight  Plan: Same medication. Return in 3-4 months. Be faithful in working hard on weight loss, especially in avoiding soft drinks and eating less

## 2014-09-30 NOTE — Patient Instructions (Addendum)
Continue current medication  Work hard on continuing to try and eat and drink less in order to lose weight. Continue size.  Return in about February for a follow-up.

## 2015-10-01 ENCOUNTER — Other Ambulatory Visit: Payer: Self-pay | Admitting: Family Medicine

## 2015-10-12 ENCOUNTER — Ambulatory Visit (INDEPENDENT_AMBULATORY_CARE_PROVIDER_SITE_OTHER): Payer: BC Managed Care – PPO | Admitting: Family Medicine

## 2015-10-12 VITALS — BP 118/72 | HR 96 | Temp 98.4°F | Resp 18 | Ht 64.5 in | Wt 233.0 lb

## 2015-10-12 DIAGNOSIS — S46911A Strain of unspecified muscle, fascia and tendon at shoulder and upper arm level, right arm, initial encounter: Secondary | ICD-10-CM

## 2015-10-12 DIAGNOSIS — I10 Essential (primary) hypertension: Secondary | ICD-10-CM

## 2015-10-12 DIAGNOSIS — H109 Unspecified conjunctivitis: Secondary | ICD-10-CM | POA: Diagnosis not present

## 2015-10-12 MED ORDER — OFLOXACIN 0.3 % OP SOLN: 1.0000 [drp] | Freq: Four times a day (QID) | OPHTHALMIC | Status: DC

## 2015-10-12 MED ORDER — METHOCARBAMOL 750 MG PO TABS: 750.0000 mg | ORAL_TABLET | Freq: Three times a day (TID) | ORAL | Status: DC

## 2015-10-12 MED ORDER — LISINOPRIL-HYDROCHLOROTHIAZIDE 20-12.5 MG PO TABS: ORAL_TABLET | ORAL | Status: DC

## 2015-10-12 NOTE — Patient Instructions (Signed)
Take Aleve (naproxen) 220 mg 2 pills twice daily with food  Take the methocarbamol, muscle relaxant, 750 mg one at bedtime. If worse can take 13 times daily if needed  Use the ofloxacin eyedrops 1 or 2 drops in left eye 4 times daily  Continue taking your lisinopril HCT blood pressure medication one daily  Return if further problems  Return 6 months for blood pressure recheck and labs

## 2015-10-12 NOTE — Progress Notes (Signed)
Patient ID: Kristine Mcdonald, female    DOB: 15-Sep-1971  Age: 44 y.o. MRN: [id number]  Chief Complaint  Patient presents with  . Conjunctivitis    eye redness this morning-left   . Shoulder Pain    pulled muscle last week pain moving down rt arm     Subjective:   44 year old lady who is here for a couple of things. This morning she awakened with a man and left eye which was reddened. She does not know of any foreign body and has not had any foreign body sensation. She has not been immediately expose, she is a Chartered loss adjusterschoolteacher. However she was off last week and a conference. She has had a tiny bit of sneezing but no major upper respiratory symptoms.  She also has been hurting her right shoulder for a week or so. She had been to a conference and carries a bag with her computer and supplies and it. She knows of no specific injury. She does do walking for exercise but does not do any upper body exercise particularly. Her shoulder has felt better the last couple days that she's got home. She has taken a few Tylenol, nothing else much.  She continues to take her blood pressure medication on a daily basis. She monitors her blood pressure at home and it is been good. She's not any chest pain or symptoms of her heart. No GI complaints.    Current allergies, medications, problem list, past/family and social histories reviewed.  Objective:  BP 118/72 mmHg  Pulse 96  Temp(Src) 98.4 F (36.9 C) (Oral)  Resp 18  Ht 5' 4.5" (1.638 m)  Wt 233 lb (105.688 kg)  BMI 39.39 kg/m2  SpO2 98%  LMP 10/09/2015  Healthy-appearing lady, overweight but she is working on that now. Her TMs are normal. Eyes PERRLA. Fundi benign with discs flat. Left conjunctiva slightly moist and injected. The throat was clear. TMs normal. Neck supple without significant nodes. Chest is clear to auscultation. Heart regular without murmur. Right shoulder has a pain patch on it right now. It's not particularly tender except for very  mild tenderness below the patch in the right upper scapular region. She has good range of motion. Mild pain when going against resistance. Otherwise it is nonpainful now. Grip is good and her hand.  Assessment & Plan:   Assessment: 1. Essential hypertension   2. Conjunctivitis, left eye   3. Right shoulder strain, initial encounter       Plan: No labs or x-rays will be done today. She is to continue taking her same blood pressure medication, he is ofloxacin eyedrops, and some Robaxin for her shoulder low taking OTC naproxen    Meds ordered this encounter  Medications  . ferrous sulfate 325 (65 FE) MG tablet    Sig: Take 325 mg by mouth daily with breakfast.  . lisinopril-hydrochlorothiazide (PRINZIDE,ZESTORETIC) 20-12.5 MG tablet    Sig: TAKE 1 TABLET BY MOUTH DAILY    Dispense:  90 tablet    Refill:  1  . ofloxacin (OCUFLOX) 0.3 % ophthalmic solution    Sig: Place 1 drop into the left eye 4 (four) times daily.    Dispense:  5 mL    Refill:  0  . methocarbamol (ROBAXIN) 750 MG tablet    Sig: Take 1 tablet (750 mg total) by mouth 3 (three) times daily.    Dispense:  20 tablet    Refill:  0         Patient  Instructions  Take Aleve (naproxen) 220 mg 2 pills twice daily with food  Take the methocarbamol, muscle relaxant, 750 mg one at bedtime. If worse can take 13 times daily if needed  Use the ofloxacin eyedrops 1 or 2 drops in left eye 4 times daily  Continue taking your lisinopril HCT blood pressure medication one daily  Return if further problems  Return 6 months for blood pressure recheck and labs     Return in about 6 months (around 04/11/2016).   HOPPER,DAVID, MD 10/12/2015

## 2016-05-10 ENCOUNTER — Other Ambulatory Visit: Payer: Self-pay | Admitting: Family Medicine

## 2016-11-04 ENCOUNTER — Other Ambulatory Visit: Payer: Self-pay | Admitting: Family Medicine

## 2016-12-09 ENCOUNTER — Ambulatory Visit (INDEPENDENT_AMBULATORY_CARE_PROVIDER_SITE_OTHER): Payer: BC Managed Care – PPO | Admitting: Physician Assistant

## 2016-12-09 VITALS — BP 130/80 | HR 93 | Temp 98.6°F | Resp 17 | Ht 64.5 in | Wt 245.0 lb

## 2016-12-09 DIAGNOSIS — H6982 Other specified disorders of Eustachian tube, left ear: Secondary | ICD-10-CM

## 2016-12-09 NOTE — Patient Instructions (Signed)
     IF you received an x-ray today, you will receive an invoice from [facility]. Please contact [facility] at [phone] with questions or concerns regarding your invoice.   IF you received labwork today, you will receive an invoice from [facility]. Please contact [facility] at [phone] with questions or concerns regarding your invoice.   Our billing staff will not be able to assist you with questions regarding bills from these companies.  You will be contacted with the lab results as soon as they are available. The fastest way to get your results is to activate your My Chart account. Instructions are located on the last page of this paperwork. If you have not heard from us regarding the results in 2 weeks, please contact this office.     

## 2016-12-09 NOTE — Progress Notes (Signed)
   12/09/2016 6:52 PM   DOB: 15-Jan-1971 / MRN: [id number]  SUBJECTIVE:  Kristine Mcdonald is a 45 y.o. female presenting for ears feeling clogged for the last three days.  This did seem to get better with popping her ears this morning.  She had a cold last week.  She denies any ear pain today.  She does associate a change in hearing.   She is allergic to iohexol.   She  has a past medical history of Hypertension.    She  reports that she has never smoked. She has never used smokeless tobacco. She reports that she does not drink alcohol or use drugs. She  reports that she does not engage in sexual activity. The patient  has a past surgical history that includes Appendectomy.  Her family history includes Hypertension in her mother.  Review of Systems  Constitutional: Negative for fever.  HENT: Positive for congestion and hearing loss. Negative for ear discharge, ear pain, sinus pain, sore throat and tinnitus.   Skin: Negative for rash.  Neurological: Negative for dizziness and headaches.    The problem list and medications were reviewed and updated by myself where necessary and exist elsewhere in the encounter.   OBJECTIVE:  BP 130/80 (BP Location: Right Arm, Patient Position: Sitting, Cuff Size: Large)   Pulse 93   Temp 98.6 F (37 C) (Oral)   Resp 17   Ht 5' 4.5" (1.638 m)   Wt 245 lb (111.1 kg)   SpO2 100%   BMI 41.40 kg/m   Physical Exam  HENT:  Right Ear: Tympanic membrane normal.  Left Ear: Tympanic membrane is retracted. Tympanic membrane is not erythematous.  Nose: Mucosal edema present.  Mouth/Throat: Uvula is midline, oropharynx is clear and moist and mucous membranes are normal.  Cardiovascular: Normal rate.   Pulmonary/Chest: Effort normal.    No results found for this or any previous visit (from the past 72 hour(s)).  No results found.  ASSESSMENT AND PLAN  Kristine Mcdonald was seen today for ear pain.  Diagnoses and all orders for this visit:  ETD (Eustachian  tube dysfunction), left: Improving.  Advised Afrin only as needed and not for more than three days along with phenylephrine containing OTC meds.      The patient is advised to call or return to clinic if she does not see an improvement in symptoms, or to seek the care of the closest emergency department if she worsens with the above plan.   Kristine Mcdonald, MHS, PA-C [facility] 12/09/2016 6:52 PM

## 2016-12-22 ENCOUNTER — Other Ambulatory Visit: Payer: Self-pay | Admitting: Urgent Care

## 2016-12-23 ENCOUNTER — Telehealth: Payer: Self-pay

## 2016-12-23 NOTE — Telephone Encounter (Signed)
Pt is requesting a refill on lisinopril. She was in on 12/09/16 by Deliah BostonMichael Clark for an unrelated issue, but had her BP checked. States that this med is working well. Pharmacy sent in a request for her refill as she only has about three days left. Has scheduled an OV for the 2nd but is not sure if she needs to come in.

## 2016-12-27 ENCOUNTER — Ambulatory Visit (INDEPENDENT_AMBULATORY_CARE_PROVIDER_SITE_OTHER): Payer: BC Managed Care – PPO | Admitting: Family Medicine

## 2016-12-27 VITALS — BP 138/87 | HR 94 | Temp 98.5°F | Resp 18 | Ht 64.5 in | Wt 247.4 lb

## 2016-12-27 DIAGNOSIS — E6609 Other obesity due to excess calories: Secondary | ICD-10-CM | POA: Diagnosis not present

## 2016-12-27 DIAGNOSIS — H109 Unspecified conjunctivitis: Secondary | ICD-10-CM | POA: Diagnosis not present

## 2016-12-27 DIAGNOSIS — I10 Essential (primary) hypertension: Secondary | ICD-10-CM | POA: Diagnosis not present

## 2016-12-27 DIAGNOSIS — IMO0001 Reserved for inherently not codable concepts without codable children: Secondary | ICD-10-CM

## 2016-12-27 DIAGNOSIS — Z6841 Body Mass Index (BMI) 40.0 and over, adult: Secondary | ICD-10-CM | POA: Diagnosis not present

## 2016-12-27 DIAGNOSIS — Z23 Encounter for immunization: Secondary | ICD-10-CM | POA: Diagnosis not present

## 2016-12-27 MED ORDER — OFLOXACIN 0.3 % OP SOLN: 2.0000 [drp] | Freq: Four times a day (QID) | OPHTHALMIC | Status: DC

## 2016-12-27 MED ORDER — OFLOXACIN 0.3 % OP SOLN: 2.0000 [drp] | Freq: Four times a day (QID) | OPHTHALMIC | 0 refills | Status: DC

## 2016-12-27 MED ORDER — LISINOPRIL-HYDROCHLOROTHIAZIDE 20-12.5 MG PO TABS: 1.0000 | ORAL_TABLET | Freq: Every day | ORAL | 3 refills | Status: DC

## 2016-12-27 NOTE — Progress Notes (Signed)
Patient ID: Carolynn ServeSabrina M Enderson, female    DOB: 07-24-1971  Age: 46 y.o. MRN: [id number]  Chief Complaint  Patient presents with  . Medication Refill    Lisinopril  . Eye Problem    x3 days; possible pink eye; drainage and blurry    Subjective:   46 year old lady previously known to me who is here today for a refill on her blood pressure medication. She does monitor her blood pressure at home, and that usually runs in the 120 and 1:30 range over low 80s. She goes to the gym several times a week to get exercise. She does not smoke. She is not having headaches or dizziness or chest pains or palpitations or GI complaints.  She has been having problems with her right eye for about 3 days. It is irritated and itching, has been slightly pink, and has had some drainage and morning mattering. 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  She has not had any foreign objects in her eyes and has not been exposed to likely sites of getting stuff in her eye.  She has not had her flu shot yet, we discussed the importance of trying to the preventative on that.    Current allergies, medications, problem list, past/family and social histories reviewed.  Objective:  BP 138/87   Pulse 94   Temp 98.5 F (36.9 C) (Oral)   Resp 18   Ht 5' 4.5" (1.638 m)   Wt 247 lb 6.4 oz (112.2 kg)   SpO2 99%   BMI 41.81 kg/m   Pleasant lady, alert and oriented, no acute distress. TMs normal. She had had some eustachian tube dysfunction some weeks ago when she came in, but that seems to have resolved. Her eyes PERRLA. Right eye looks minimally more moist than the left, not significantly erythematous today. Fundi appear benign. Nothing that would make me suspect a foreign object in the eye. Throat was clear. Neck supple without significant nodes. No thyromegaly. Chest clear to auscultation. Heart regular without murmur. Abdomen soft and  nontender.  Assessment & Plan:   Assessment: 1. Conjunctivitis of right eye, unspecified conjunctivitis type   2. Essential hypertension   3. Class 3 obesity due to excess calories without serious comorbidity with body mass index (BMI) of 40.0 to 44.9 in adult (HCC)   4. Needs flu shot       Plan: We'll refill her blood pressure medicine. Urged her to increase her exercise on a regular basis and to cut back on eating to get rid of the holiday pounds. Stress that blood pressure was closely tied to her weight.  Will treat with ofloxacin ophthalmic, and she is to return if not doing better.  No orders of the defined types were placed in this encounter.   Meds ordered this encounter  Medications  . ofloxacin (OCUFLOX) 0.3 % ophthalmic solution 2 drop  . lisinopril-hydrochlorothiazide (PRINZIDE,ZESTORETIC) 20-12.5 MG tablet    Sig: Take 1 tablet by mouth daily.    Dispense:  90 tablet    Refill:  3    PATIENT NEEDS OFFICE VISIT FOR ADDITIONAL REFILLS         Patient Instructions   Use the ofloxacin eyedrops 1 or 2 drops in right eye 4 times daily. If the left eye seems to be getting inflamed use them there.  Good handwashing  Take your blood pressure medicine faithfully  Your BMI which is a  measure of your weight, is moderately high at 41. Work hard on the weight loss for your general health and for the sake of your blood pressure. Continue monitoring your blood pressure carefully.  Return in about 6 months or as needed  Flu shot will be given today.    IF you received an x-ray today, you will receive an invoice from [facility]. Please contact [facility] at [phone] with questions or concerns regarding your invoice.   IF you received labwork today, you will receive an invoice from [facility]. Please contact [facility] at [phone] with questions or concerns regarding your invoice.   Our billing staff will not be able to assist you with  questions regarding bills from these companies.  You will be contacted with the lab results as soon as they are available. The fastest way to get your results is to activate your My Chart account. Instructions are located on the last page of this paperwork. If you have not heard from [place] regarding the results in 2 weeks, please contact this office.         No Follow-up on file.   Hans Rusher, MD 12/27/2016

## 2016-12-27 NOTE — Patient Instructions (Addendum)
Use the ofloxacin eyedrops 1 or 2 drops in right eye 4 times daily. If the left eye seems to be getting inflamed use them there.  Good handwashing  Take your blood pressure medicine faithfully  Your BMI which is a measure of your weight, is moderately high at 41. Work hard on the weight loss for your general health and for the sake of your blood pressure. Continue monitoring your blood pressure carefully.  Return in about 6 months or as needed  Flu shot will be given today.    IF you received an x-ray today, you will receive an invoice from [facility]. Please contact [facility] at [phone] with questions or concerns regarding your invoice.   IF you received labwork today, you will receive an invoice from [facility]. Please contact [facility] at [phone] with questions or concerns regarding your invoice.   Our billing staff will not be able to assist you with questions regarding bills from these companies.  You will be contacted with the lab results as soon as they are available. The fastest way to get your results is to activate your My Chart account. Instructions are located on the last page of this paperwork. If you have not heard from us regarding the results in 2 weeks, please contact this office.

## 2017-12-17 ENCOUNTER — Other Ambulatory Visit: Payer: Self-pay | Admitting: Family Medicine

## 2017-12-17 DIAGNOSIS — I10 Essential (primary) hypertension: Secondary | ICD-10-CM

## 2018-01-18 ENCOUNTER — Other Ambulatory Visit: Payer: Self-pay | Admitting: Family Medicine

## 2018-01-18 DIAGNOSIS — I10 Essential (primary) hypertension: Secondary | ICD-10-CM

## 2018-01-18 NOTE — Telephone Encounter (Signed)
Last OV 12/27/16 with Dr. Alwyn RenHopper.   Needs OV

## 2018-01-22 ENCOUNTER — Other Ambulatory Visit: Payer: Self-pay | Admitting: Family Medicine

## 2018-01-22 DIAGNOSIS — I10 Essential (primary) hypertension: Secondary | ICD-10-CM

## 2018-02-21 ENCOUNTER — Encounter: Payer: Self-pay | Admitting: Family Medicine

## 2018-02-21 ENCOUNTER — Ambulatory Visit: Payer: BC Managed Care – PPO | Admitting: Family Medicine

## 2018-02-21 ENCOUNTER — Other Ambulatory Visit: Payer: Self-pay

## 2018-02-21 VITALS — BP 118/72 | HR 94 | Temp 98.0°F | Resp 16 | Ht 64.96 in | Wt 248.0 lb

## 2018-02-21 DIAGNOSIS — I10 Essential (primary) hypertension: Secondary | ICD-10-CM

## 2018-02-21 DIAGNOSIS — Z6841 Body Mass Index (BMI) 40.0 and over, adult: Secondary | ICD-10-CM | POA: Diagnosis not present

## 2018-02-21 DIAGNOSIS — Z131 Encounter for screening for diabetes mellitus: Secondary | ICD-10-CM

## 2018-02-21 DIAGNOSIS — Z13220 Encounter for screening for lipoid disorders: Secondary | ICD-10-CM

## 2018-02-21 MED ORDER — LISINOPRIL-HYDROCHLOROTHIAZIDE 20-12.5 MG PO TABS: 1.0000 | ORAL_TABLET | Freq: Every day | ORAL | 1 refills | Status: DC

## 2018-02-21 NOTE — Patient Instructions (Addendum)
   IF you received an x-ray today, you will receive an invoice from [facility]. Please contact [facility] at [phone] with questions or concerns regarding your invoice.   IF you received labwork today, you will receive an invoice from [facility]. Please contact [facility] at [phone] with questions or concerns regarding your invoice.   Our billing staff will not be able to assist you with questions regarding bills from these companies.  You will be contacted with the lab results as soon as they are available. The fastest way to get your results is to activate your My Chart account. Instructions are located on the last page of this paperwork. If you have not heard from us regarding the results in 2 weeks, please contact this office.      Managing Your Hypertension Hypertension is commonly called high blood pressure. This is when the force of your blood pressing against the walls of your arteries is too strong. Arteries are blood vessels that carry blood from your heart throughout your body. Hypertension forces the heart to work harder to pump blood, and may cause the arteries to become narrow or stiff. Having untreated or uncontrolled hypertension can cause heart attack, stroke, kidney disease, and other problems. What are blood pressure readings? A blood pressure reading consists of a higher number over a lower number. Ideally, your blood pressure should be below 120/80. The first ("top") number is called the systolic pressure. It is a measure of the pressure in your arteries as your heart beats. The second ("bottom") number is called the diastolic pressure. It is a measure of the pressure in your arteries as the heart relaxes. What does my blood pressure reading mean? Blood pressure is classified into four stages. Based on your blood pressure reading, your health care provider may use the following stages to determine what type of treatment you need, if any. Systolic  pressure and diastolic pressure are measured in a unit called mm Hg. Normal  Systolic pressure: below 120.  Diastolic pressure: below 80. Elevated  Systolic pressure: 120-129.  Diastolic pressure: below 80. Hypertension stage 1  Systolic pressure: 130-139.  Diastolic pressure: 80-89. Hypertension stage 2  Systolic pressure: 140 or above.  Diastolic pressure: 90 or above. What health risks are associated with hypertension? Managing your hypertension is an important responsibility. Uncontrolled hypertension can lead to:  A heart attack.  A stroke.  A weakened blood vessel (aneurysm).  Heart failure.  Kidney damage.  Eye damage.  Metabolic syndrome.  Memory and concentration problems.  What changes can I make to manage my hypertension? Hypertension can be managed by making lifestyle changes and possibly by taking medicines. Your health care provider will help you make a plan to bring your blood pressure within a normal range. Eating and drinking  Eat a diet that is high in fiber and potassium, and low in salt (sodium), added sugar, and fat. An example eating plan is called the DASH (Dietary Approaches to Stop Hypertension) diet. To eat this way: ? Eat plenty of fresh fruits and vegetables. Try to fill half of your plate at each meal with fruits and vegetables. ? Eat whole grains, such as whole wheat pasta, brown rice, or whole grain bread. Fill about one quarter of your plate with whole grains. ? Eat low-fat diary products. ? Avoid fatty cuts of meat, processed or cured meats, and poultry with skin. Fill about one quarter of your plate with lean proteins such as fish, chicken without skin, beans, eggs,   and tofu. ? Avoid premade and processed foods. These tend to be higher in sodium, added sugar, and fat.  Reduce your daily sodium intake. Most people with hypertension should eat less than 1,500 mg of sodium a day.  Limit alcohol intake to no more than 1 drink a day  for nonpregnant women and 2 drinks a day for men. One drink equals 12 oz of beer, 5 oz of wine, or 1 oz of hard liquor. Lifestyle  Work with your health care provider to maintain a healthy body weight, or to lose weight. Ask what an ideal weight is for you.  Get at least 30 minutes of exercise that causes your heart to beat faster (aerobic exercise) most days of the week. Activities may include walking, swimming, or biking.  Include exercise to strengthen your muscles (resistance exercise), such as weight lifting, as part of your weekly exercise routine. Try to do these types of exercises for 30 minutes at least 3 days a week.  Do not use any products that contain nicotine or tobacco, such as cigarettes and e-cigarettes. If you need help quitting, ask your health care provider.  Control any long-term (chronic) conditions you have, such as high cholesterol or diabetes. Monitoring  Monitor your blood pressure at home as told by your health care provider. Your personal target blood pressure may vary depending on your medical conditions, your age, and other factors.  Have your blood pressure checked regularly, as often as told by your health care provider. Working with your health care provider  Review all the medicines you take with your health care provider because there may be side effects or interactions.  Talk with your health care provider about your diet, exercise habits, and other lifestyle factors that may be contributing to hypertension.  Visit your health care provider regularly. Your health care provider can help you create and adjust your plan for managing hypertension. Will I need medicine to control my blood pressure? Your health care provider may prescribe medicine if lifestyle changes are not enough to get your blood pressure under control, and if:  Your systolic blood pressure is 130 or higher.  Your diastolic blood pressure is 80 or higher.  Take medicines only as told  by your health care provider. Follow the directions carefully. Blood pressure medicines must be taken as prescribed. The medicine does not work as well when you skip doses. Skipping doses also puts you at risk for problems. Contact a health care provider if:  You think you are having a reaction to medicines you have taken.  You have repeated (recurrent) headaches.  You feel dizzy.  You have swelling in your ankles.  You have trouble with your vision. Get help right away if:  You develop a severe headache or confusion.  You have unusual weakness or numbness, or you feel faint.  You have severe pain in your chest or abdomen.  You vomit repeatedly.  You have trouble breathing. Summary  Hypertension is when the force of blood pumping through your arteries is too strong. If this condition is not controlled, it may put you at risk for serious complications.  Your personal target blood pressure may vary depending on your medical conditions, your age, and other factors. For most people, a normal blood pressure is less than 120/80.  Hypertension is managed by lifestyle changes, medicines, or both. Lifestyle changes include weight loss, eating a healthy, low-sodium diet, exercising more, and limiting alcohol. This information is not intended to replace advice   given to you by your health care provider. Make sure you discuss any questions you have with your health care provider. Document Released: 09/05/2012 Document Revised: 11/09/2016 Document Reviewed: 11/09/2016 [employer]vier Interactive Patient Education  2018 [employer]  

## 2018-02-21 NOTE — Progress Notes (Signed)
Subjective:    Patient ID: ANTWANETTE WESCHE, female    DOB: 02/10/1971, 47 y.o.   MRN: [id number]  02/21/2018  Establish Care (pt would like to establish a PCP and need refill on Lisinopril ) and Hypertension    HPI This 47 y.o. female presents for evaluation of hypertension and to establish care.   Last physical:  Several years ago. Pap smear:  Years ago; regular menses; bleeds 4 days; no intermenstrual bleeding. Mammogram:  Never; needs one Colonoscopy:  Not yet. Eye exam: none Dental exam:  none  HTN: home BP running 118/72.  Checks in morning.   110/70-120s/90..  Duration of blood pressure year 2.    BP Readings from Last 3 Encounters:  02/21/18 118/72  12/27/16 138/87  12/09/16 130/80   Wt Readings from Last 3 Encounters:  02/21/18 248 lb (112.5 kg)  12/27/16 247 lb 6.4 oz (112.2 kg)  12/09/16 245 lb (111.1 kg)   Immunization History  Administered Date(s) Administered  . Influenza,inj,Quad PF,6+ Mos 09/30/2014, 12/27/2016    Review of Systems  Constitutional: Negative for activity change, appetite change, chills, diaphoresis, fatigue, fever and unexpected weight change.  HENT: Negative for congestion, dental problem, drooling, ear discharge, ear pain, facial swelling, hearing loss, mouth sores, nosebleeds, postnasal drip, rhinorrhea, sinus pressure, sneezing, sore throat, tinnitus, trouble swallowing and voice change.   Eyes: Negative for photophobia, pain, discharge, redness, itching and visual disturbance.  Respiratory: Negative for apnea, cough, choking, chest tightness, shortness of breath, wheezing and stridor.   Cardiovascular: Negative for chest pain, palpitations and leg swelling.  Gastrointestinal: Negative for abdominal distention, abdominal pain, anal bleeding, blood in stool, constipation, diarrhea, nausea, rectal pain and vomiting.  Endocrine: Negative for cold intolerance, heat intolerance, polydipsia, polyphagia and polyuria.  Genitourinary:  Negative for decreased urine volume, difficulty urinating, dyspareunia, dysuria, enuresis, flank pain, frequency, genital sores, hematuria, menstrual problem, pelvic pain, urgency, vaginal bleeding, vaginal discharge and vaginal pain.  Musculoskeletal: Negative for arthralgias, back pain, gait problem, joint swelling, myalgias, neck pain and neck stiffness.  Skin: Negative for color change, pallor, rash and wound.  Allergic/Immunologic: Negative for environmental allergies, food allergies and immunocompromised state.  Neurological: Negative for dizziness, tremors, seizures, syncope, facial asymmetry, speech difficulty, weakness, light-headedness, numbness and headaches.  Hematological: Negative for adenopathy. Does not bruise/bleed easily.  Psychiatric/Behavioral: Negative for agitation, behavioral problems, confusion, decreased concentration, dysphoric mood, hallucinations, self-injury, sleep disturbance and suicidal ideas. The patient is not nervous/anxious and is not hyperactive.     Past Medical History:  Diagnosis Date  . Hypertension    Past Surgical History:  Procedure Laterality Date  . APPENDECTOMY     Allergies  Allergen Reactions  . Iohexol      Code: HIVES, Desc: hives/rash/leg & arm pain a few hours after iv contrast administration- pt admitted- benadryl & solu-medrol given- 13-hr pre-meds required for future iv contrast administration, Onset Date: [id number]    No current outpatient medications on file prior to visit.   No current facility-administered medications on file prior to visit.    Social History   Socioeconomic History  . Marital status: Single    Spouse name: Not on file  . Number of children: Not on file  . Years of education: Not on file  . Highest education level: Not on file  Social Needs  . Financial resource strain: Not on file  . Food insecurity - worry: Not on file  . Food insecurity - inability: Not on file  . Transportation  needs - medical: Not  on file  . Transportation needs - non-medical: Not on file  Occupational History  . Not on file  Tobacco Use  . Smoking status: Never Smoker  . Smokeless tobacco: Never Used  Substance and Sexual Activity  . Alcohol use: No  . Drug use: No  . Sexual activity: No  Other Topics Concern  . Not on file  Social History Narrative   Marital status: separated in 2019 after 14 years of marriage.  Not dating in 2019.      Children: 3 children (24, 20, 19); no grandchildren.      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      Tobacco: none      Alcohol: none      Drugs: none      Exercise:  Some; sporadic in 2019.  Treadmill at gym three days per week.           Family History  Problem Relation Age of Onset  . Hypertension Mother   . Arthritis Father        Objective:    BP 118/72   Pulse 94   Temp 98 F (36.7 C) (Oral)   Resp 16   Ht 5' 4.96" (1.65 m)   Wt 248 lb (112.5 kg)   LMP  (LMP Unknown)   SpO2 94%   BMI 41.32 kg/m  Physical Exam  Constitutional: She is oriented to person, place, and time. She appears well-developed and well-nourished. No distress.  HENT:  Head: Normocephalic and atraumatic.  Right Ear: External ear normal.  Left Ear: External ear normal.  Nose: Nose normal.  Mouth/Throat: Oropharynx is clear and moist.  Eyes: Conjunctivae and EOM are normal. Pupils are equal, round, and reactive to light.  Neck: Normal range of motion. Neck supple. Carotid bruit is not present. No thyromegaly present.  Cardiovascular: Normal rate, regular rhythm, normal heart sounds and intact distal pulses. Exam reveals no gallop and no friction rub.  No murmur heard. Pulmonary/Chest: Effort normal and breath sounds normal. She has no wheezes. She has no rales.  Abdominal: Soft. Bowel sounds are normal. She exhibits no distension and no mass. There is no tenderness. There is no rebound and  no guarding.  Lymphadenopathy:    She has no cervical adenopathy.  Neurological: She is alert and oriented to person, place, and time. No cranial nerve deficit.  Skin: Skin is warm and dry. No rash noted. She is not diaphoretic. No erythema. No pallor.  Psychiatric: She has a normal mood and affect. Her behavior is normal.   No results found. [facility] screen [facility] 02/21/2018 12/27/2016 12/09/2016 10/12/2015  Decreased Interest 0 0 0 0  Down, Depressed, Hopeless 0 0 0 0  PHQ - 2 Score 0 0 0 0   Fall Risk  02/21/2018 12/27/2016 12/09/2016  Falls in the past year? No No No        Assessment & Plan:   1. Essential hypertension   2. Screening for diabetes mellitus   3. Screening, lipid   4. Class 3 severe obesity due to excess calories with serious comorbidity and body mass index (BMI) of 40.0 to 44.9 in adult [facility](HCC)     Hypertension well controlled on current regimen.  Obtain labs for chronic disease management.  Warrants extensive laboratory workup including urinalysis and as well as EKG; this is  not been completed in the past.  No changes to management at this time.  Refill of lisinopril provided.  Obesity:  recommend weight loss, exercise for 30-60 minutes five days per week; recommend 1200 kcal restriction per day with a minimum of 60 grams of protein per day.   Orders Placed This Encounter  Procedures  . CBC with Differential/Platelet  . Comprehensive metabolic panel    Order Specific Question:   Has the patient fasted?    Answer:   No  . Lipid panel    Order Specific Question:   Has the patient fasted?    Answer:   No  . TSH  . Urinalysis, dipstick only  . EKG 12-Lead   Meds ordered this encounter  Medications  . lisinopril-hydrochlorothiazide (PRINZIDE,ZESTORETIC) 20-12.5 MG tablet    Sig: Take 1 tablet by mouth daily. Office visit needed for 90 day supply    Dispense:  90 tablet    Refill:  1    Return in about 4 months (around 06/21/2018) for complete physical  examiniation.   Khamora Karan Paulita Fujita, M.D. Primary Care at [facility] [address] [place], [place]  [address] [phone] phone [phone] fax

## 2018-02-22 LAB — URINALYSIS, DIPSTICK ONLY
BILIRUBIN UA: NEGATIVE
GLUCOSE, UA: NEGATIVE
KETONES UA: NEGATIVE
Leukocytes, UA: NEGATIVE
Nitrite, UA: NEGATIVE
PROTEIN UA: NEGATIVE
RBC UA: NEGATIVE
SPEC GRAV UA: 1.021 (ref 1.005–1.030)
Urobilinogen, Ur: 0.2 mg/dL (ref 0.2–1.0)
pH, UA: 5.5 (ref 5.0–7.5)

## 2018-02-22 LAB — CBC WITH DIFFERENTIAL/PLATELET
BASOS ABS: 0 10*3/uL (ref 0.0–0.2)
Basos: 0 %
EOS (ABSOLUTE): 0.4 10*3/uL (ref 0.0–0.4)
Eos: 4 %
Hematocrit: 37.9 % (ref 34.0–46.6)
Hemoglobin: 13 g/dL (ref 11.1–15.9)
Immature Grans (Abs): 0 10*3/uL (ref 0.0–0.1)
Immature Granulocytes: 0 %
Lymphocytes Absolute: 3.5 10*3/uL — ABNORMAL HIGH (ref 0.7–3.1)
Lymphs: 35 %
MCH: 28.8 pg (ref 26.6–33.0)
MCHC: 34.3 g/dL (ref 31.5–35.7)
MCV: 84 fL (ref 79–97)
MONOS ABS: 0.7 10*3/uL (ref 0.1–0.9)
Monocytes: 7 %
Neutrophils Absolute: 5.3 10*3/uL (ref 1.4–7.0)
Neutrophils: 54 %
Platelets: 452 10*3/uL — ABNORMAL HIGH (ref 150–379)
RBC: 4.52 x10E6/uL (ref 3.77–5.28)
RDW: 14.2 % (ref 12.3–15.4)
WBC: 9.9 10*3/uL (ref 3.4–10.8)

## 2018-02-22 LAB — LIPID PANEL
CHOLESTEROL TOTAL: 198 mg/dL (ref 100–199)
Chol/HDL Ratio: 3.8 ratio (ref 0.0–4.4)
HDL: 52 mg/dL (ref >39)
LDL CALC: 122 mg/dL — AB (ref 0–99)
TRIGLYCERIDES: 120 mg/dL (ref 0–149)
VLDL Cholesterol Cal: 24 mg/dL (ref 5–40)

## 2018-02-22 LAB — COMPREHENSIVE METABOLIC PANEL
ALK PHOS: 94 IU/L (ref 39–117)
ALT: 32 IU/L (ref 0–32)
AST: 18 IU/L (ref 0–40)
Albumin/Globulin Ratio: 1.5 (ref 1.2–2.2)
Albumin: 4.4 g/dL (ref 3.5–5.5)
BILIRUBIN TOTAL: 0.3 mg/dL (ref 0.0–1.2)
BUN/Creatinine Ratio: 14 (ref 9–23)
BUN: 15 mg/dL (ref 6–24)
CHLORIDE: 97 mmol/L (ref 96–106)
CO2: 25 mmol/L (ref 20–29)
Calcium: 10.1 mg/dL (ref 8.7–10.2)
Creatinine, Ser: 1.04 mg/dL — ABNORMAL HIGH (ref 0.57–1.00)
GFR calc Af Amer: 73 mL/min/{1.73_m2} (ref >59)
GFR calc non Af Amer: 64 mL/min/{1.73_m2} (ref >59)
GLOBULIN, TOTAL: 3 g/dL (ref 1.5–4.5)
GLUCOSE: 86 mg/dL (ref 65–99)
Potassium: 4.6 mmol/L (ref 3.5–5.2)
SODIUM: 139 mmol/L (ref 134–144)
Total Protein: 7.4 g/dL (ref 6.0–8.5)

## 2018-02-22 LAB — TSH: TSH: 1.13 u[IU]/mL (ref 0.450–4.500)

## 2018-05-17 ENCOUNTER — Encounter: Payer: Self-pay | Admitting: Family Medicine

## 2018-08-15 ENCOUNTER — Other Ambulatory Visit: Payer: Self-pay | Admitting: Family Medicine

## 2018-08-15 DIAGNOSIS — I10 Essential (primary) hypertension: Secondary | ICD-10-CM

## 2018-10-20 ENCOUNTER — Other Ambulatory Visit: Payer: Self-pay | Admitting: Family Medicine

## 2018-10-20 DIAGNOSIS — I10 Essential (primary) hypertension: Secondary | ICD-10-CM

## 2018-10-22 NOTE — Telephone Encounter (Signed)
Requested medication (s) are due for refill today: yes  Requested medication (s) are on the active medication list: yes  Last refill:  08/15/18  Future visit scheduled: yes  Notes to clinic:  Labs overdue. Last office visit was 02/21/18.  Requested Prescriptions  Pending Prescriptions Disp Refills   lisinopril-hydrochlorothiazide (PRINZIDE,ZESTORETIC) 20-12.5 MG tablet [Pharmacy Med Name: LISINOPRIL-HCTZ 20-12.5 MG TAB] 30 tablet 1    Sig: TAKE 1 TABLET BY MOUTH DAILY. OFFICE VISIT NEEDED FOR 7 DAY SUPPLY     Cardiovascular:  ACEI + Diuretic Combos Failed - 10/20/2018  9:41 AM      Failed - Na in normal range and within 180 days    Sodium  Date Value Ref Range Status  02/21/2018 139 134 - 144 mmol/L Final         Failed - K in normal range and within 180 days    Potassium  Date Value Ref Range Status  02/21/2018 4.6 3.5 - 5.2 mmol/L Final    Comment:    Specimen received in contact with cells. No visible hemolysis present. However GLUC may be decreased and K increased. Clinical correlation indicated.          Failed - Cr in normal range and within 180 days    Creat  Date Value Ref Range Status  09/15/2013 1.46 (H) 0.50 - 1.10 mg/dL Final   Creatinine, Ser  Date Value Ref Range Status  02/21/2018 1.04 (H) 0.57 - 1.00 mg/dL Final         Failed - Ca in normal range and within 180 days    Calcium  Date Value Ref Range Status  02/21/2018 10.1 8.7 - 10.2 mg/dL Final         Failed - Valid encounter within last 6 months    Recent Outpatient Visits          8 months ago Essential hypertension   Primary Care at [facility] Myrle Sheng, MD   1 year ago Conjunctivitis of right eye, unspecified conjunctivitis type   Primary Care at [facility], Sandria Bales, MD   1 year ago ETD (Eustachian tube dysfunction), left   [facility] at [facility], Marolyn Hammock, PA-C   3 years ago Essential hypertension   Primary Care at [facility], Sandria Bales, MD   4 years ago Essential  hypertension   Primary Care at [facility], Sandria Bales, MD      Future Appointments            In 1 week Bing Neighbors, FNP Primary Care at [place], [facility]           Passed - Patient is not pregnant      Passed - Last BP in normal range    BP Readings from Last 1 Encounters:  02/21/18 118/72

## 2018-11-03 ENCOUNTER — Other Ambulatory Visit: Payer: Self-pay

## 2018-11-03 ENCOUNTER — Encounter: Payer: Self-pay | Admitting: Family Medicine

## 2018-11-03 ENCOUNTER — Ambulatory Visit: Payer: BC Managed Care – PPO | Admitting: Family Medicine

## 2018-11-03 VITALS — BP 116/84 | HR 94 | Temp 99.2°F | Resp 16 | Ht 64.0 in | Wt 248.8 lb

## 2018-11-03 DIAGNOSIS — Z23 Encounter for immunization: Secondary | ICD-10-CM

## 2018-11-03 DIAGNOSIS — I10 Essential (primary) hypertension: Secondary | ICD-10-CM

## 2018-11-03 NOTE — Patient Instructions (Addendum)
   If you have lab work done today you will be contacted with your lab results within the next 2 weeks.  If you have not heard from us then please contact us. The fastest way to get your results is to register for My Chart.   IF you received an x-ray today, you will receive an invoice from [facility]. Please contact [facility] at [phone] with questions or concerns regarding your invoice.   IF you received labwork today, you will receive an invoice from [facility]. Please contact [facility] at [phone] with questions or concerns regarding your invoice.   Our billing staff will not be able to assist you with questions regarding bills from these companies.  You will be contacted with the lab results as soon as they are available. The fastest way to get your results is to activate your My Chart account. Instructions are located on the last page of this paperwork. If you have not heard from us regarding the results in 2 weeks, please contact this office.    Influenza (Flu) Vaccine (Inactivated or Recombinant): What You Need to Know 1. Why get vaccinated? Influenza ("flu") is a contagious disease that spreads around the [place] every year, usually between October and May. Flu is caused by influenza viruses, and is spread mainly by coughing, sneezing, and close contact. Anyone can get flu. Flu strikes suddenly and can last several days. Symptoms vary by age, but can include:  fever/chills  sore throat  muscle aches  fatigue  cough  headache  runny or stuffy nose  Flu can also lead to pneumonia and blood infections, and cause diarrhea and seizures in children. If you have a medical condition, such as heart or lung disease, flu can make it worse. Flu is more dangerous for some people. Infants and young children, people 65 years of age and older, pregnant women, and people with certain health conditions or a weakened immune system are at greatest risk. Each  year thousands of people in the [place] die from flu, and many more are hospitalized. Flu vaccine can:  keep you from getting flu,  make flu less severe if you do get it, and  keep you from spreading flu to your family and other people. 2. Inactivated and recombinant flu vaccines A dose of flu vaccine is recommended every flu season. Children 6 months through 8 years of age may need two doses during the same flu season. Everyone else needs only one dose each flu season. Some inactivated flu vaccines contain a very small amount of a mercury-based preservative called thimerosal. Studies have not shown thimerosal in vaccines to be harmful, but flu vaccines that do not contain thimerosal are available. There is no live flu virus in flu shots. They cannot cause the flu. There are many flu viruses, and they are always changing. Each year a new flu vaccine is made to protect against three or four viruses that are likely to cause disease in the upcoming flu season. But even when the vaccine doesn't exactly match these viruses, it may still provide some protection. Flu vaccine cannot prevent:  flu that is caused by a virus not covered by the vaccine, or  illnesses that look like flu but are not.  It takes about 2 weeks for protection to develop after vaccination, and protection lasts through the flu season. 3. Some people should not get this vaccine Tell the person who is giving you the vaccine:  If you have any severe,   life-threatening allergies. If you ever had a life-threatening allergic reaction after a dose of flu vaccine, or have a severe allergy to any part of this vaccine, you may be advised not to get vaccinated. Most, but not all, types of flu vaccine contain a small amount of egg protein.  If you ever had Guillain-Barr Syndrome (also called GBS). Some people with a history of GBS should not get this vaccine. This should be discussed with your doctor.  If you are not feeling well.  It is usually okay to get flu vaccine when you have a mild illness, but you might be asked to come back when you feel better.  4. Risks of a vaccine reaction With any medicine, including vaccines, there is a chance of reactions. These are usually mild and go away on their own, but serious reactions are also possible. Most people who get a flu shot do not have any problems with it. Minor problems following a flu shot include:  soreness, redness, or swelling where the shot was given  hoarseness  sore, red or itchy eyes  cough  fever  aches  headache  itching  fatigue  If these problems occur, they usually begin soon after the shot and last 1 or 2 days. More serious problems following a flu shot can include the following:  There may be a small increased risk of Guillain-Barre Syndrome (GBS) after inactivated flu vaccine. This risk has been estimated at 1 or 2 additional cases per million people vaccinated. This is much lower than the risk of severe complications from flu, which can be prevented by flu vaccine.  Young children who get the flu shot along with pneumococcal vaccine (PCV13) and/or DTaP vaccine at the same time might be slightly more likely to have a seizure caused by fever. Ask your doctor for more information. Tell your doctor if a child who is getting flu vaccine has ever had a seizure.  Problems that could happen after any injected vaccine:  People sometimes faint after a medical procedure, including vaccination. Sitting or lying down for about 15 minutes can help prevent fainting, and injuries caused by a fall. Tell your doctor if you feel dizzy, or have vision changes or ringing in the ears.  Some people get severe pain in the shoulder and have difficulty moving the arm where a shot was given. This happens very rarely.  Any medication can cause a severe allergic reaction. Such reactions from a vaccine are very rare, estimated at about 1 in a million doses, and  would happen within a few minutes to a few hours after the vaccination. As with any medicine, there is a very remote chance of a vaccine causing a serious injury or death. The safety of vaccines is always being monitored. For more information, visit: www.cdc.gov/vaccinesafety/ 5. What if there is a serious reaction? What should I look for? Look for anything that concerns you, such as signs of a severe allergic reaction, very high fever, or unusual behavior. Signs of a severe allergic reaction can include hives, swelling of the face and throat, difficulty breathing, a fast heartbeat, dizziness, and weakness. These would start a few minutes to a few hours after the vaccination. What should I do?  If you think it is a severe allergic reaction or other emergency that can't wait, call 9-1-1 and get the person to the nearest hospital. Otherwise, call your doctor.  Reactions should be reported to the Vaccine Adverse Event Reporting System (VAERS). Your doctor should file   this report, or you can do it yourself through the VAERS web site at www.vaers.hhs.gov, or by calling 1-800-822-7967. ? VAERS does not give medical advice. 6. The National Vaccine Injury Compensation Program The National Vaccine Injury Compensation Program (VICP) is a federal program that was created to compensate people who may have been injured by certain vaccines. Persons who believe they may have been injured by a vaccine can learn about the program and about filing a claim by calling 1-800-338-2382 or visiting the VICP website at www.hrsa.gov/vaccinecompensation. There is a time limit to file a claim for compensation. 7. How can I learn more?  Ask your healthcare provider. He or she can give you the vaccine package insert or suggest other sources of information.  Call your local or state health department.  Contact the Centers for Disease Control and Prevention (CDC): ? Call 1-800-232-4636 (1-800-CDC-INFO) or ? Visit CDC's  website at www.cdc.gov/flu Vaccine Information Statement, Inactivated Influenza Vaccine (08/01/2014) This information is not intended to replace advice given to you by your health care provider. Make sure you discuss any questions you have with your health care provider. Document Released: 10/06/2006 Document Revised: 09/01/2016 Document Reviewed: 09/01/2016 Elsevier Interactive Patient Education  2017 Elsevier Inc.  

## 2018-11-03 NOTE — Progress Notes (Signed)
Patient ID: Kristine Mcdonald, female    DOB: 04-29-1971, 48 y.o.   MRN: [id number]  PCP: Doristine Bosworth, MD  No chief complaint on file.   Subjective:  HPI Kristine Mcdonald is a 47 y.o. female presents for evaluation medication refill of BP medication.[name] reports no home monitoring of blood pressure. Reports adherence to blood pressure medications. Reports efforts to adhere to low sodium diet. She is a nonsmoker. Denies any episodes of dizziness,headaches, shortness of breath, or chest pain. Social History   Socioeconomic History  . Marital status: Single    Spouse name: Not on file  . Number of children: Not on file  . Years of education: Not on file  . Highest education level: Not on file  Occupational History  . Not on file  Social Needs  . Financial resource strain: Not on file  . Food insecurity:    Worry: Not on file    Inability: Not on file  . Transportation needs:    Medical: Not on file    Non-medical: Not on file  Tobacco Use  . Smoking status: Never Smoker  . Smokeless tobacco: Never Used  Substance and Sexual Activity  . Alcohol use: No  . Drug use: No  . Sexual activity: Never  Lifestyle  . Physical activity:    Days per week: Not on file    Minutes per session: Not on file  . Stress: Not on file  Relationships  . Social connections:    Talks on phone: Not on file    Gets together: Not on file    Attends religious service: Not on file    Active member of club or organization: Not on file    Attends meetings of clubs or organizations: Not on file    Relationship status: Not on file  . Intimate partner violence:    Fear of current or ex partner: Not on file    Emotionally abused: Not on file    Physically abused: Not on file    Forced sexual activity: Not on file  Other Topics Concern  . Not on file  Social History Narrative   Marital status: separated in 2019 after 14 years of marriage.  Not dating in 2019.      Children: 3  children (24, 20, 19); no grandchildren.      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      Tobacco: none      Alcohol: none      Drugs: none      Exercise:  Some; sporadic in 2019.  Treadmill at gym three days per week.            Family History  Problem Relation Age of Onset  . Hypertension Mother   . Arthritis Father     Review of Systems Pertinent negatives listed in HPI Patient Active Problem List   Diagnosis Date Noted  . Essential hypertension 09/30/2014    Allergies  Allergen Reactions  . Iohexol      Code: HIVES, Desc: hives/rash/leg & arm pain a few hours after iv contrast administration- pt admitted- benadryl & solu-medrol given- 13-hr pre-meds required for future iv contrast administration, Onset Date: [id number]     Prior to Admission medications   Medication Sig Start Date End Date Taking? Authorizing Provider  lisinopril-hydrochlorothiazide (PRINZIDE,ZESTORETIC) 20-12.5 MG tablet TAKE 1 TABLET  BY MOUTH DAILY. OFFICE VISIT NEEDED FOR 8 DAY SUPPLY 10/22/18   Myles Lipps, MD    Past Medical, Surgical Family and Social History reviewed and updated.    Objective:   Today's Vitals   11/03/18 0808  BP: 116/84  Pulse: 94  Resp: 16  Temp: 99.2 F (37.3 C)  TempSrc: Oral  SpO2: 98%  Weight: 248 lb 12.8 oz (112.9 kg)  Height: 5\' 4"  (1.626 m)    Wt Readings from Last 3 Encounters:  02/21/18 248 lb (112.5 kg)  12/27/16 247 lb 6.4 oz (112.2 kg)  12/09/16 245 lb (111.1 kg)     Physical Exam General appearance: alert, well developed, well nourished, cooperative and in no distress Head: Normocephalic, without obvious abnormality, atraumatic Respiratory: Respirations even and unlabored, normal respiratory rate Heart: rate and rhythm normal. No gallop or murmurs noted on exam  Extremities: No gross deformities Skin: Skin color, texture, turgor normal. No rashes  seen  Psych: Appropriate mood and affect. Neurologic: Mental status: Alert, oriented to person, place, and time, thought content appropriate.    Assessment & Plan:  1. Essential hypertension, well controlled. We have discussed target BP range and blood pressure goal. I have advised patient to check BP regularly and to call us back or report to clinic if the numbers are consistently higher than 140/90. We discussed the importance of compliance with medical therapy and DASH diet recommended, consequences of uncontrolled hypertension discussed.  - Continue current BP medications - Comprehensive metabolic panel, evaluate potassium and renal function.  2. Flu vaccine need -given    -The patient was given clear instructions to go to ER or return to medical center if symptoms do not improve, worsen or new problems develop. The patient verbalized understanding.     Godfrey Pick. Tiburcio Pea, FNP-C Nurse Practitioner (PRN Staff)  Primary Care at [facility] [address] [place], [place]  [phone]

## 2018-11-04 LAB — COMPREHENSIVE METABOLIC PANEL
A/G RATIO: 1.6 (ref 1.2–2.2)
ALT: 23 IU/L (ref 0–32)
AST: 19 IU/L (ref 0–40)
Albumin: 4.2 g/dL (ref 3.5–5.5)
Alkaline Phosphatase: 84 IU/L (ref 39–117)
BILIRUBIN TOTAL: 0.4 mg/dL (ref 0.0–1.2)
BUN/Creatinine Ratio: 13 (ref 9–23)
BUN: 14 mg/dL (ref 6–24)
CHLORIDE: 99 mmol/L (ref 96–106)
CO2: 24 mmol/L (ref 20–29)
Calcium: 9.5 mg/dL (ref 8.7–10.2)
Creatinine, Ser: 1.11 mg/dL — ABNORMAL HIGH (ref 0.57–1.00)
GFR, EST AFRICAN AMERICAN: 67 mL/min/{1.73_m2} (ref >59)
GFR, EST NON AFRICAN AMERICAN: 58 mL/min/{1.73_m2} — AB (ref >59)
GLOBULIN, TOTAL: 2.7 g/dL (ref 1.5–4.5)
Glucose: 98 mg/dL (ref 65–99)
POTASSIUM: 4.3 mmol/L (ref 3.5–5.2)
SODIUM: 141 mmol/L (ref 134–144)
Total Protein: 6.9 g/dL (ref 6.0–8.5)

## 2018-11-05 ENCOUNTER — Encounter: Payer: Self-pay | Admitting: Family Medicine

## 2018-11-05 ENCOUNTER — Other Ambulatory Visit: Payer: Self-pay | Admitting: Family Medicine

## 2018-11-05 DIAGNOSIS — I10 Essential (primary) hypertension: Secondary | ICD-10-CM

## 2018-11-08 ENCOUNTER — Telehealth: Payer: Self-pay

## 2018-11-08 NOTE — Telephone Encounter (Signed)
Copied from CRM [phone]. Topic: General - Other >> Nov 08, 2018  5:26 PM Tamela OddiHarris, Brenda J wrote: Reason for CRM: Patient said she is returning call regarding test results.  Patient would like a call back.  CB# [phone]  Message sent to Lab Pool-

## 2019-02-26 ENCOUNTER — Ambulatory Visit: Payer: BC Managed Care – PPO | Admitting: Emergency Medicine

## 2019-02-26 ENCOUNTER — Encounter: Payer: Self-pay | Admitting: Emergency Medicine

## 2019-02-26 VITALS — BP 107/70 | HR 101 | Temp 98.9°F | Resp 17 | Ht 64.0 in | Wt 253.0 lb

## 2019-02-26 DIAGNOSIS — H1033 Unspecified acute conjunctivitis, bilateral: Secondary | ICD-10-CM | POA: Insufficient documentation

## 2019-02-26 MED ORDER — OFLOXACIN 0.3 % OP SOLN: 2.0000 [drp] | Freq: Three times a day (TID) | OPHTHALMIC | 1 refills | Status: DC

## 2019-02-26 NOTE — Patient Instructions (Addendum)
If you have lab work done today you will be contacted with your lab results within the next 2 weeks.  If you have not heard from [place] then please contact us. The fastest way to get your results is to register for My Chart.   IF you received an x-ray today, you will receive an invoice from [facility]. Please contact [facility] at [phone] with questions or concerns regarding your invoice.   IF you received labwork today, you will receive an invoice from [facility]. Please contact [facility] at [phone] with questions or concerns regarding your invoice.   Our billing staff will not be able to assist you with questions regarding bills from these companies.  You will be contacted with the lab results as soon as they are available. The fastest way to get your results is to activate your My Chart account. Instructions are located on the last page of this paperwork. If you have not heard from [place] regarding the results in 2 weeks, please contact this office.      Bacterial Conjunctivitis, Adult Bacterial conjunctivitis is an infection of your conjunctiva. This is the clear membrane that covers the white part of your eye and the inner part of your eyelid. This infection can make your eye:  Red or pink.  Itchy. This condition spreads easily from person to person (is contagious) and from one eye to the other eye. What are the causes?  This condition is caused by germs (bacteria). You may get the infection if you come into close contact with: ? A person who has the infection. ? Items that have germs on them (are contaminated), such as face towels, contact lens solution, or eye makeup. What increases the risk? You are more likely to get this condition if you:  Have contact with people who have the infection.  Wear contact lenses.  Have a sinus infection.  Have had a recent eye injury or surgery.  Have a weak body defense system (immune system).  Have dry  eyes. What are the signs or symptoms?   Thick, yellowish discharge from the eye.  Tearing or watery eyes.  Itchy eyes.  Burning feeling in your eyes.  Eye redness.  Swollen eyelids.  Blurred vision. How is this treated?   Antibiotic eye drops or ointment.  Antibiotic medicine taken by mouth. This is used for infections that do not get better with drops or ointment or that last more than 10 days.  Cool, wet cloths placed on the eyes.  Artificial tears used 2-6 times a day. Follow these instructions at home: Medicines  Take or apply your antibiotic medicine as told by your doctor. Do not stop taking or applying the antibiotic even if you start to feel better.  Take or apply over-the-counter and prescription medicines only as told by your doctor.  Do not touch your eyelid with the eye-drop bottle or the ointment tube. Managing discomfort  Wipe any fluid from your eye with a warm, wet washcloth or a cotton ball.  Place a clean, cool, wet cloth on your eye. Do this for 10-20 minutes, 3-4 times per day. General instructions  Do not wear contacts until the infection is gone. Wear glasses until your doctor says it is okay to wear contacts again.  Do not wear eye makeup until the infection is gone. Throw away old eye makeup.  Change or wash your pillowcase every day.  Do not share towels or washcloths.  Wash your hands often with  soap and water. Use paper towels to dry your hands.  Do not touch or rub your eyes.  Do not drive or use heavy machinery if your vision is blurred. Contact a doctor if:  You have a fever.  You do not get better after 10 days. Get help right away if:  You have a fever and your symptoms get worse all of a sudden.  You have very bad pain when you move your eye.  Your face: ? Hurts. ? Is red. ? Is swollen.  You have sudden loss of vision. Summary  Bacterial conjunctivitis is an infection of your conjunctiva.  This infection  spreads easily from person to person.  Wash your hands often with soap and water. Use paper towels to dry your hands.  Take or apply your antibiotic medicine as told by your doctor.  Contact a doctor if you have a fever or you do not get better after 10 days. This information is not intended to replace advice given to you by your health care provider. Make sure you discuss any questions you have with your health care provider. Document Released: 09/20/2008 Document Revised: 07/18/2018 Document Reviewed: 07/18/2018 Elsevier Interactive Patient Education  2019 [employer].

## 2019-02-26 NOTE — Progress Notes (Signed)
Kristine Mcdonald 48 y.o.   Chief Complaint  Patient presents with  . Follow-up    pink eye     HISTORY OF PRESENT ILLNESS: This is a 48 y.o. female developed pinkeye 3 days ago.  Used eyedrops for 2 days and then ran out of it.  Still having some symptoms, mostly in the morning with crusty discharge.  Denies visual disturbances.  Denies trauma.  No contact lens use.  No other significant symptoms.  HPI   Prior to Admission medications   Medication Sig Start Date End Date Taking? Authorizing Provider  lisinopril-hydrochlorothiazide (PRINZIDE,ZESTORETIC) 20-12.5 MG tablet Take 1 tablet by mouth daily. 11/05/18  Yes Doristine Bosworth, MD    Allergies  Allergen Reactions  . Iohexol      Code: HIVES, Desc: hives/rash/leg & arm pain a few hours after iv contrast administration- pt admitted- benadryl & solu-medrol given- 13-hr pre-meds required for future iv contrast administration, Onset Date: [id number]     Patient Active Problem List   Diagnosis Date Noted  . Essential hypertension 09/30/2014    Past Medical History:  Diagnosis Date  . Hypertension     Past Surgical History:  Procedure Laterality Date  . APPENDECTOMY      Social History   Socioeconomic History  . Marital status: Single    Spouse name: Not on file  . Number of children: Not on file  . Years of education: Not on file  . Highest education level: Not on file  Occupational History  . Not on file  Social Needs  . Financial resource strain: Not on file  . Food insecurity:    Worry: Not on file    Inability: Not on file  . Transportation needs:    Medical: Not on file    Non-medical: Not on file  Tobacco Use  . Smoking status: Never Smoker  . Smokeless tobacco: Never Used  Substance and Sexual Activity  . Alcohol use: No  . Drug use: No  . Sexual activity: Never  Lifestyle  . Physical activity:    Days per week: Not on file    Minutes per session: Not on file  . Stress: Not on file    Relationships  . Social connections:    Talks on phone: Not on file    Gets together: Not on file    Attends religious service: Not on file    Active member of club or organization: Not on file    Attends meetings of clubs or organizations: Not on file    Relationship status: Not on file  . Intimate partner violence:    Fear of current or ex partner: Not on file    Emotionally abused: Not on file    Physically abused: Not on file    Forced sexual activity: Not on file  Other Topics Concern  . Not on file  Social History Narrative   Marital status: separated in 2019 after 14 years of marriage.  Not dating in 2019.      Children: 3 children (24, 20, 19); no grandchildren.      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      Tobacco: none      Alcohol: none      Drugs: none      Exercise:  Some; sporadic in 2019.  Treadmill at gym three days per week.  Family History  Problem Relation Age of Onset  . Hypertension Mother   . Arthritis Father      Review of Systems  Constitutional: Negative for chills and fever.  HENT: Negative for sore throat.   Eyes: Positive for discharge and redness. Negative for blurred vision, double vision and pain.  Respiratory: Negative for cough and shortness of breath.   Cardiovascular: Negative.  Negative for chest pain and palpitations.  Gastrointestinal: Negative for abdominal pain, nausea and vomiting.  Musculoskeletal: Negative.   Skin: Negative for rash.  Neurological: Negative for dizziness and headaches.  All other systems reviewed and are negative.  Vitals:   02/26/19 1508  BP: 107/70  Pulse: (!) 101  Resp: 17  Temp: 98.9 F (37.2 C)  SpO2: 98%     Physical Exam Vitals signs reviewed.  Constitutional:      Appearance: Normal appearance.  HENT:     Head: Normocephalic and atraumatic.     Nose: Nose normal.     Mouth/Throat:      Mouth: Mucous membranes are moist.     Pharynx: Oropharynx is clear.  Eyes:     Extraocular Movements: Extraocular movements intact.     Conjunctiva/sclera:     Right eye: Right conjunctiva is injected.     Left eye: Left conjunctiva is injected.     Pupils: Pupils are equal, round, and reactive to light.  Neck:     Musculoskeletal: Normal range of motion.  Cardiovascular:     Rate and Rhythm: Normal rate and regular rhythm.  Pulmonary:     Effort: Pulmonary effort is normal.  Musculoskeletal: Normal range of motion.  Skin:    General: Skin is warm and dry.  Neurological:     General: No focal deficit present.     Mental Status: She is alert and oriented to person, place, and time.      ASSESSMENT & PLAN: Kristine Mcdonald was seen today for follow-up.  Diagnoses and all orders for this visit:  Acute conjunctivitis of both eyes, unspecified acute conjunctivitis type -     ofloxacin (OCUFLOX) 0.3 % ophthalmic solution; Place 2 drops into both eyes 3 (three) times daily.    Patient Instructions       If you have lab work done today you will be contacted with your lab results within the next 2 weeks.  If you have not heard from us then please contact us. The fastest way to get your results is to register for My Chart.   IF you received an x-ray today, you will receive an invoice from [facility]. Please contact [facility] at [phone] with questions or concerns regarding your invoice.   IF you received labwork today, you will receive an invoice from [facility]. Please contact [facility] at [phone] with questions or concerns regarding your invoice.   Our billing staff will not be able to assist you with questions regarding bills from these companies.  You will be contacted with the lab results as soon as they are available. The fastest way to get your results is to activate your My Chart account. Instructions are located on the last page of this  paperwork. If you have not heard from us regarding the results in 2 weeks, please contact this office.      Bacterial Conjunctivitis, Adult Bacterial conjunctivitis is an infection of your conjunctiva. This is the clear membrane that covers the white part of your eye and the inner part of your eyelid. This infection can make  your eye:  Red or pink.  Itchy. This condition spreads easily from person to person (is contagious) and from one eye to the other eye. What are the causes?  This condition is caused by germs (bacteria). You may get the infection if you come into close contact with: ? A person who has the infection. ? Items that have germs on them (are contaminated), such as face towels, contact lens solution, or eye makeup. What increases the risk? You are more likely to get this condition if you:  Have contact with people who have the infection.  Wear contact lenses.  Have a sinus infection.  Have had a recent eye injury or surgery.  Have a weak body defense system (immune system).  Have dry eyes. What are the signs or symptoms?   Thick, yellowish discharge from the eye.  Tearing or watery eyes.  Itchy eyes.  Burning feeling in your eyes.  Eye redness.  Swollen eyelids.  Blurred vision. How is this treated?   Antibiotic eye drops or ointment.  Antibiotic medicine taken by mouth. This is used for infections that do not get better with drops or ointment or that last more than 10 days.  Cool, wet cloths placed on the eyes.  Artificial tears used 2-6 times a day. Follow these instructions at home: Medicines  Take or apply your antibiotic medicine as told by your doctor. Do not stop taking or applying the antibiotic even if you start to feel better.  Take or apply over-the-counter and prescription medicines only as told by your doctor.  Do not touch your eyelid with the eye-drop bottle or the ointment tube. Managing discomfort  Wipe any fluid from  your eye with a warm, wet washcloth or a cotton ball.  Place a clean, cool, wet cloth on your eye. Do this for 10-20 minutes, 3-4 times per day. General instructions  Do not wear contacts until the infection is gone. Wear glasses until your doctor says it is okay to wear contacts again.  Do not wear eye makeup until the infection is gone. Throw away old eye makeup.  Change or wash your pillowcase every day.  Do not share towels or washcloths.  Wash your hands often with soap and water. Use paper towels to dry your hands.  Do not touch or rub your eyes.  Do not drive or use heavy machinery if your vision is blurred. Contact a doctor if:  You have a fever.  You do not get better after 10 days. Get help right away if:  You have a fever and your symptoms get worse all of a sudden.  You have very bad pain when you move your eye.  Your face: ? Hurts. ? Is red. ? Is swollen.  You have sudden loss of vision. Summary  Bacterial conjunctivitis is an infection of your conjunctiva.  This infection spreads easily from person to person.  Wash your hands often with soap and water. Use paper towels to dry your hands.  Take or apply your antibiotic medicine as told by your doctor.  Contact a doctor if you have a fever or you do not get better after 10 days. This information is not intended to replace advice given to you by your health care provider. Make sure you discuss any questions you have with your health care provider. Document Released: 09/20/2008 Document Revised: 07/18/2018 Document Reviewed: 07/18/2018 Elsevier Interactive Patient Education  2019 Elsevier Inc.      Edwina Barth, MD [facility]

## 2019-05-16 ENCOUNTER — Other Ambulatory Visit: Payer: Self-pay | Admitting: Family Medicine

## 2019-05-16 DIAGNOSIS — I10 Essential (primary) hypertension: Secondary | ICD-10-CM

## 2019-06-09 ENCOUNTER — Other Ambulatory Visit: Payer: Self-pay | Admitting: Family Medicine

## 2019-06-09 DIAGNOSIS — I10 Essential (primary) hypertension: Secondary | ICD-10-CM

## 2019-07-12 ENCOUNTER — Other Ambulatory Visit: Payer: Self-pay | Admitting: Family Medicine

## 2019-07-12 DIAGNOSIS — I10 Essential (primary) hypertension: Secondary | ICD-10-CM

## 2019-07-12 NOTE — Telephone Encounter (Signed)
Forwarding medication refill to provider for review. 

## 2019-07-15 NOTE — Telephone Encounter (Signed)
Refilled Informed needs OV for further refills

## 2019-07-31 ENCOUNTER — Ambulatory Visit: Payer: BC Managed Care – PPO | Admitting: Family Medicine

## 2019-07-31 ENCOUNTER — Other Ambulatory Visit: Payer: Self-pay

## 2019-07-31 ENCOUNTER — Encounter: Payer: Self-pay | Admitting: Family Medicine

## 2019-07-31 VITALS — BP 120/83 | HR 95 | Temp 98.4°F | Resp 17 | Ht 64.5 in | Wt 247.4 lb

## 2019-07-31 DIAGNOSIS — Z13220 Encounter for screening for lipoid disorders: Secondary | ICD-10-CM | POA: Diagnosis not present

## 2019-07-31 DIAGNOSIS — I10 Essential (primary) hypertension: Secondary | ICD-10-CM | POA: Diagnosis not present

## 2019-07-31 DIAGNOSIS — Z833 Family history of diabetes mellitus: Secondary | ICD-10-CM

## 2019-07-31 DIAGNOSIS — Z131 Encounter for screening for diabetes mellitus: Secondary | ICD-10-CM

## 2019-07-31 MED ORDER — LISINOPRIL-HYDROCHLOROTHIAZIDE 20-12.5 MG PO TABS: 1.0000 | ORAL_TABLET | Freq: Every day | ORAL | 3 refills | Status: DC

## 2019-07-31 NOTE — Progress Notes (Signed)
Established Patient Office Visit  Subjective:  Patient ID: Kristine Mcdonald, female    DOB: [birth year]  Age: 48 y.o. MRN: [id number]  CC:  Chief Complaint  Patient presents with  . Medication Refill    zestoretic- 90 day supply request    HPI MCKAILA DUFFUS presents for   Hypertension: Patient here for follow-up of elevated blood pressure. She is not exercising and is adherent to low salt diet.  Blood pressure is well controlled at home. Cardiac symptoms none. Patient denies chest pain, chest pressure/discomfort, claudication, dyspnea, exertional chest pressure/discomfort, fatigue, irregular heart beat, lower extremity edema, near-syncope, orthopnea, palpitations and paroxysmal nocturnal dyspnea.  Cardiovascular risk factors: hypertension, obesity (BMI >= 30 kg/m2) and sedentary lifestyle. Use of agents associated with hypertension: none. History of target organ damage: none. BP Readings from Last 3 Encounters:  07/31/19 120/83  02/26/19 107/70  11/03/18 116/84   Morbid Obesity Body mass index is 41.81 kg/m. Wt Readings from Last 3 Encounters:  07/31/19 247 lb 6.4 oz (112.2 kg)  02/26/19 253 lb (114.8 kg)  11/03/18 248 lb 12.8 oz (112.9 kg)   She is eating better since working from home She does not exercise And since the pandemic she has been cooking more  Family History Diabetes She has a family history of diabetes She denies polyuria, polydipsia, polyphagia No increase in fatigue Lab Results  Component Value Date   CHOL 198 02/21/2018   CHOL 164 09/15/2013   Lab Results  Component Value Date   HDL 52 02/21/2018   HDL 45 09/15/2013   Lab Results  Component Value Date   LDLCALC 122 (H) 02/21/2018   LDLCALC 104 (H) 09/15/2013   Lab Results  Component Value Date   TRIG 120 02/21/2018   TRIG 74 09/15/2013   Lab Results  Component Value Date   CHOLHDL 3.8 02/21/2018   CHOLHDL 3.6 09/15/2013   No results found for: LDLDIRECT   Past Medical History:   Diagnosis Date  . Hypertension     Past Surgical History:  Procedure Laterality Date  . APPENDECTOMY      Family History  Problem Relation Age of Onset  . Hypertension Mother   . Arthritis Father     Social History   Socioeconomic History  . Marital status: Single    Spouse name: Not on file  . Number of children: Not on file  . Years of education: Not on file  . Highest education level: Not on file  Occupational History  . Not on file  Social Needs  . Financial resource strain: Not on file  . Food insecurity    Worry: Not on file    Inability: Not on file  . Transportation needs    Medical: Not on file    Non-medical: Not on file  Tobacco Use  . Smoking status: Never Smoker  . Smokeless tobacco: Never Used  Substance and Sexual Activity  . Alcohol use: No  . Drug use: No  . Sexual activity: Never  Lifestyle  . Physical activity    Days per week: Not on file    Minutes per session: Not on file  . Stress: Not on file  Relationships  . Social Herbalist on phone: Not on file    Gets together: Not on file    Attends religious service: Not on file    Active member of club or organization: Not on file    Attends meetings of clubs or organizations:  Not on file    Relationship status: Not on file  . Intimate partner violence    Fear of current or ex partner: Not on file    Emotionally abused: Not on file    Physically abused: Not on file    Forced sexual activity: Not on file  Other Topics Concern  . Not on file  Social History Narrative   Marital status: separated in 2019 after 14 years of marriage.  Not dating in 2019.      Children: 3 children (24, 20, 19); no grandchildren.      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      Tobacco: none      Alcohol: none      Drugs: none      Exercise:  Some; sporadic in 2019.  Treadmill at gym three days per week.             Outpatient Medications Prior to Visit  Medication Sig Dispense Refill  . lisinopril-hydrochlorothiazide (ZESTORETIC) 20-12.5 MG tablet Take 1 tablet by mouth daily. Needs office visit for further refills. 30 tablet 0  . ofloxacin (OCUFLOX) 0.3 % ophthalmic solution Place 2 drops into both eyes 3 (three) times daily. (Patient not taking: Reported on 07/31/2019) 5 mL 1   No facility-administered medications prior to visit.     Allergies  Allergen Reactions  . Iohexol      Code: HIVES, Desc: hives/rash/leg & arm pain a few hours after iv contrast administration- pt admitted- benadryl & solu-medrol given- 13-hr pre-meds required for future iv contrast administration, Onset Date: [id number]     ROS Review of Systems See hpi   Objective:    Physical Exam  BP 120/83 (BP Location: Left Arm, Patient Position: Sitting, Cuff Size: Large)   Pulse 95   Temp 98.4 F (36.9 C) (Oral)   Resp 17   Ht 5' 4.5" (1.638 m)   Wt 247 lb 6.4 oz (112.2 kg)   LMP 07/21/2019   SpO2 96%   BMI 41.81 kg/m  Wt Readings from Last 3 Encounters:  07/31/19 247 lb 6.4 oz (112.2 kg)  02/26/19 253 lb (114.8 kg)  11/03/18 248 lb 12.8 oz (112.9 kg)   Physical Exam  Constitutional: Oriented to person, place, and time. Appears well-developed and well-nourished.  HENT:  Head: Normocephalic and atraumatic.  Eyes: Conjunctivae and EOM are normal.  Cardiovascular: Normal rate, regular rhythm, normal heart sounds and intact distal pulses.  No murmur heard. Pulmonary/Chest: Effort normal and breath sounds normal. No stridor. No respiratory distress. Has no wheezes.  Neurological: Is alert and oriented to person, place, and time.  Skin: Skin is warm. Capillary refill takes less than 2 seconds.  Psychiatric: Has a normal mood and affect. Behavior is normal. Judgment and thought content normal.    Health Maintenance Due  Topic Date Due  . PAP SMEAR-Modifier  07/26/2009  . INFLUENZA VACCINE  07/27/2019     There are no preventive care reminders to display for this patient.  Lab Results  Component Value Date   TSH 1.130 02/21/2018   Lab Results  Component Value Date   WBC 9.9 02/21/2018   HGB 13.0 02/21/2018   HCT 37.9 02/21/2018   MCV 84 02/21/2018   PLT 452 (H) 02/21/2018   Lab Results  Component Value Date   NA 141 11/03/2018   K 4.3 11/03/2018  CO2 24 11/03/2018   GLUCOSE 98 11/03/2018   BUN 14 11/03/2018   CREATININE 1.11 (H) 11/03/2018   BILITOT 0.4 11/03/2018   ALKPHOS 84 11/03/2018   AST 19 11/03/2018   ALT 23 11/03/2018   PROT 6.9 11/03/2018   ALBUMIN 4.2 11/03/2018   CALCIUM 9.5 11/03/2018   Lab Results  Component Value Date   CHOL 198 02/21/2018   Lab Results  Component Value Date   HDL 52 02/21/2018   Lab Results  Component Value Date   LDLCALC 122 (H) 02/21/2018   Lab Results  Component Value Date   TRIG 120 02/21/2018   Lab Results  Component Value Date   CHOLHDL 3.8 02/21/2018   No results found for: HGBA1C    Assessment & Plan:   Problem List Items Addressed This Visit      Cardiovascular and Mediastinum   Essential hypertension  -  Patient's blood pressure is at goal of 139/89 or less. Condition is stable. Continue current medications and treatment plan. I recommend that you exercise for 30-45 minutes 5 days a week. I also recommend a balanced diet with fruits and vegetables every day, lean meats, and little fried foods. The DASH diet (you can find this online) is a good example of this.    Relevant Orders   Comprehensive metabolic panel   Lipid panel    Other Visit Diagnoses    Screening, lipid    -  Primary   Relevant Orders   Lipid panel   Screening for diabetes mellitus       Relevant Orders   Comprehensive metabolic panel   Hemoglobin A1c   Morbid obesity (HCC)    -  Discussed continued dietary modification, discussed continued weight loss       No orders of the defined types were placed in this encounter.    Follow-up: No follow-ups on file.    Doristine BosworthZoe A Saphronia Ozdemir, MD

## 2019-07-31 NOTE — Patient Instructions (Addendum)
If you have lab work done today you will be contacted with your lab results within the next 2 weeks.  If you have not heard from [place] then please contact us. The fastest way to get your results is to register for My Chart.   IF you received an x-ray today, you will receive an invoice from [facility]. Please contact [facility] at [phone] with questions or concerns regarding your invoice.   IF you received labwork today, you will receive an invoice from [place]. Please contact [facility] at [phone] with questions or concerns regarding your invoice.   Our billing staff will not be able to assist you with questions regarding bills from these companies.  You will be contacted with the lab results as soon as they are available. The fastest way to get your results is to activate your My Chart account. Instructions are located on the last page of this paperwork. If you have not heard from [place] regarding the results in 2 weeks, please contact this office.     Screening for Type 2 Diabetes  A screening test for type 2 diabetes (type 2 diabetes mellitus) is a blood test to measure your blood sugar (glucose) level. This test is done to check for early signs of diabetes, before you develop symptoms.  Type 2 diabetes is a long-term (chronic) disease. In type 2 diabetes, one or both of these problems may be present:  The pancreas does not make enough of a hormone called insulin.  Cells in the body do not respond properly to insulin that the body makes (insulin resistance). Normally, insulin allows blood sugar (glucose) to enter cells in the body. The cells use glucose for energy. Insulin resistance or lack of insulin causes excess glucose to build up in the blood instead of going into cells. This results in high blood glucose levels (hyperglycemia), which can cause many complications. You may be screened for type 2 diabetes as part of your regular health care, especially if  you have a high risk for diabetes. Screening can help to identify type 2 diabetes at its early stage (prediabetes). Identifying and treating prediabetes may delay or prevent the development of type 2 diabetes. What are the risk factors for type 2 diabetes? The following factors may make you more likely to develop type 2 diabetes:  Having a parent or sibling (first-degree relative) who has diabetes.  Being overweight or obese.  Being of American-Indian, African-American, Hispanic, Latino, Asian, or [place] descent.  Not getting enough exercise (having a sedentary lifestyle).  Being older than age 20.  Having a history of diabetes during pregnancy (gestational diabetes).  Having low levels of good cholesterol (HDL-C) or high levels of blood fats (triglycerides).  Having high blood glucose in a previous blood test.  Having high blood pressure.  Having certain diseases or conditions that may be caused by insulin resistance, including: ? Acanthosis nigricans. This is a condition that causes dark skin on the neck, armpits, and groin. ? Polycystic ovary syndrome (PCOS). ? Cardiovascular heart disease. Who should be screened for type 2 diabetes? Adults  Adults age 13 and older. These adults should be screened once every three years.  Adults who are younger than age 9, are overweight, and have one other risk factor. These adults should be screened once every three years.  Adults who have normal blood glucose levels and two or more risk factors. These adults may be screened once every year (annually).  Women who have  had gestational diabetes in the past. These women should be screened once every three years.  Pregnant women who have risk factors. These women should be screened at their first prenatal visit and again between weeks 24 and 28 of pregnancy. Children and adolescents  Children and adolescents should be screened for type 2 diabetes if they are overweight and have any  of the following risk factors: ? A family history of type 2 diabetes. ? Being a member of a high-risk ethnic group. ? Signs of insulin resistance or conditions that are associated with insulin resistance. ? A mother who had gestational diabetes while pregnant with him or her.  Screening should be done at least once every three years, starting at age 50 or at the onset of puberty, whichever comes first. Your health care provider or your child's health care provider may recommend having a screening more or less often. What happens during screening? During screening, your health care provider may ask questions about:  Your health and your risk factors, including your activity level and any medical conditions that you have.  The health of your first-degree relatives.  Past pregnancies, if this applies. Your health care provider will also do a physical exam, including a blood pressure measurement and blood tests. There are four blood tests that can be used to screen for type 2 diabetes. You may have one or more of the following:  A fasting blood glucose (FBG) test. You will not be allowed to eat (you will fast) for 8 hours or more before a blood sample is taken.  A random blood glucose test. This test checks your blood glucose at any time of the day regardless of when you ate.  An oral glucose tolerance test (OGTT). This test measures your blood glucose at two times: ? After you have not eaten (have fasted) overnight. This is your baseline glucose level. ? Two hours after you drink a glucose-containing beverage.  An A1c (hemoglobin A1c) blood test. This test provides information about blood glucose control over the previous 2-3 months. What do the results mean? Your test results are a measurement of how much glucose is in your blood. Normal blood glucose levels mean that you do not have diabetes or prediabetes. High blood glucose levels may mean that you have prediabetes or diabetes. Depending  on the results, other tests may be needed to confirm the diagnosis. You may be diagnosed with type 2 diabetes if:  Your FBG level is 126 mg/dL (7.0 mmol/L) or higher.  Your random blood glucose level is 200 mg/dL (81.111.1 mmol/L) or higher.  Your A1c level is 6.5% or higher.  Your OGTT result is higher than 200 mg/dL (91.411.1 mmol/L). These blood tests may be repeated to confirm your diagnosis. Talk with your health care provider about what your results mean. Summary  A screening test for type 2 diabetes (type 2 diabetes mellitus) is a blood test to measure your blood sugar (glucose) level.  Know what your risk factors are for developing type 2 diabetes.  If you are at risk, get screening tests as often as told by your health care provider.  Screening may help you identify type 2 diabetes at its early stage (prediabetes). Identifying and treating prediabetes may delay or prevent the development of type 2 diabetes. This information is not intended to replace advice given to you by your health care provider. Make sure you discuss any questions you have with your health care provider. Document Released: 10/08/2009 Document Revised: 04/05/2019  Document Reviewed: 01/14/2017 Elsevier Patient Education  [employer].

## 2019-08-01 LAB — HEMOGLOBIN A1C
Est. average glucose Bld gHb Est-mCnc: 134 mg/dL
Hgb A1c MFr Bld: 6.3 % — ABNORMAL HIGH (ref 4.8–5.6)

## 2019-08-01 LAB — LIPID PANEL
Chol/HDL Ratio: 4.2 ratio (ref 0.0–4.4)
Cholesterol, Total: 187 mg/dL (ref 100–199)
HDL: 45 mg/dL (ref >39)
LDL Calculated: 114 mg/dL — ABNORMAL HIGH (ref 0–99)
Triglycerides: 138 mg/dL (ref 0–149)
VLDL Cholesterol Cal: 28 mg/dL (ref 5–40)

## 2019-08-01 LAB — COMPREHENSIVE METABOLIC PANEL
ALT: 28 IU/L (ref 0–32)
AST: 17 IU/L (ref 0–40)
Albumin/Globulin Ratio: 1.7 (ref 1.2–2.2)
Albumin: 4.3 g/dL (ref 3.8–4.8)
Alkaline Phosphatase: 96 IU/L (ref 39–117)
BUN/Creatinine Ratio: 12 (ref 9–23)
BUN: 12 mg/dL (ref 6–24)
Bilirubin Total: 0.5 mg/dL (ref 0.0–1.2)
CO2: 24 mmol/L (ref 20–29)
Calcium: 9.6 mg/dL (ref 8.7–10.2)
Chloride: 96 mmol/L (ref 96–106)
Creatinine, Ser: 1 mg/dL (ref 0.57–1.00)
GFR calc Af Amer: 76 mL/min/{1.73_m2} (ref >59)
GFR calc non Af Amer: 66 mL/min/{1.73_m2} (ref >59)
Globulin, Total: 2.6 g/dL (ref 1.5–4.5)
Glucose: 81 mg/dL (ref 65–99)
Potassium: 4.4 mmol/L (ref 3.5–5.2)
Sodium: 137 mmol/L (ref 134–144)
Total Protein: 6.9 g/dL (ref 6.0–8.5)

## 2019-10-04 ENCOUNTER — Encounter: Payer: Self-pay | Admitting: Gastroenterology

## 2019-10-04 ENCOUNTER — Ambulatory Visit (INDEPENDENT_AMBULATORY_CARE_PROVIDER_SITE_OTHER): Payer: BC Managed Care – PPO | Admitting: Family Medicine

## 2019-10-04 ENCOUNTER — Other Ambulatory Visit: Payer: Self-pay

## 2019-10-04 ENCOUNTER — Other Ambulatory Visit (HOSPITAL_COMMUNITY)
Admission: RE | Admit: 2019-10-04 | Discharge: 2019-10-04 | Disposition: A | Discharge status: Home / Self Care | Payer: BC Managed Care – PPO | Source: Ambulatory Visit | Attending: Family Medicine | Admitting: Family Medicine

## 2019-10-04 ENCOUNTER — Encounter: Payer: Self-pay | Admitting: Family Medicine

## 2019-10-04 VITALS — BP 123/85 | HR 103 | Temp 98.4°F | Resp 18 | Ht 64.5 in | Wt 245.0 lb

## 2019-10-04 DIAGNOSIS — Z124 Encounter for screening for malignant neoplasm of cervix: Secondary | ICD-10-CM | POA: Diagnosis present

## 2019-10-04 DIAGNOSIS — Z0001 Encounter for general adult medical examination with abnormal findings: Secondary | ICD-10-CM | POA: Diagnosis not present

## 2019-10-04 DIAGNOSIS — Z1211 Encounter for screening for malignant neoplasm of colon: Secondary | ICD-10-CM

## 2019-10-04 DIAGNOSIS — Z23 Encounter for immunization: Secondary | ICD-10-CM

## 2019-10-04 DIAGNOSIS — Z1239 Encounter for other screening for malignant neoplasm of breast: Secondary | ICD-10-CM

## 2019-10-04 DIAGNOSIS — Z0000 Encounter for general adult medical examination without abnormal findings: Secondary | ICD-10-CM

## 2019-10-04 DIAGNOSIS — Z6841 Body Mass Index (BMI) 40.0 and over, adult: Secondary | ICD-10-CM

## 2019-10-04 NOTE — Progress Notes (Signed)
Chief Complaint  Patient presents with   Annual Exam    with pap and is fasting    Subjective:  Kristine Mcdonald is a 48 y.o. female here for a health maintenance visit.  Patient is established pt  Colon Cancer Screening she has never had a colonoscopy she denies blood in her stool, unexpected weight loss or pain with defecation No rectal itching she does not smoke sHe does not have a family history of colon cancer  Prediabetes She had an elevated a1c in August.  She is planning on working out more to improve this.    Patient Active Problem List   Diagnosis Date Noted   Acute conjunctivitis of both eyes 02/26/2019   Essential hypertension 09/30/2014    Past Medical History:  Diagnosis Date   Hypertension     Past Surgical History:  Procedure Laterality Date   APPENDECTOMY       Outpatient Medications Prior to Visit  Medication Sig Dispense Refill   lisinopril-hydrochlorothiazide (ZESTORETIC) 20-12.5 MG tablet Take 1 tablet by mouth daily. 90 tablet 3   ofloxacin (OCUFLOX) 0.3 % ophthalmic solution Place 2 drops into both eyes 3 (three) times daily. (Patient not taking: Reported on 07/31/2019) 5 mL 1   No facility-administered medications prior to visit.     Allergies  Allergen Reactions   Iohexol      Code: HIVES, Desc: hives/rash/leg & arm pain a few hours after iv contrast administration- pt admitted- benadryl & solu-medrol given- 13-hr pre-meds required for future iv contrast administration, Onset Date: [id number]      Family History  Problem Relation Age of Onset   Hypertension Mother    Arthritis Father      Health Habits: Dental Exam: up to date Eye Exam: up to date Exercise: 1-2 times/week on average Current exercise activities: walking at work Diet: balanced  Social History   Socioeconomic History   Marital status: Single    Spouse name: Not on file   Number of children: Not on file   Years of education: Not on file    Highest education level: Not on file  Occupational History   Not on file  Social Needs   Financial resource strain: Not on file   Food insecurity    Worry: Not on file    Inability: Not on file   Transportation needs    Medical: Not on file    Non-medical: Not on file  Tobacco Use   Smoking status: Never Smoker   Smokeless tobacco: Never Used  Substance and Sexual Activity   Alcohol use: No   Drug use: No   Sexual activity: Never  Lifestyle   Physical activity    Days per week: Not on file    Minutes per session: Not on file   Stress: Not on file  Relationships   Social connections    Talks on phone: Not on file    Gets together: Not on file    Attends religious service: Not on file    Active member of club or organization: Not on file    Attends meetings of clubs or organizations: Not on file    Relationship status: Not on file   Intimate partner violence    Fear of current or ex partner: Not on file    Emotionally abused: Not on file    Physically abused: Not on file    Forced sexual activity: Not on file  Other Topics Concern   Not on file  Social History Narrative   Marital status: separated in 2019 after 14 years of marriage.  Not dating in 2019.      Children: 3 children (24, 20, 19); no grandchildren.      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      Tobacco: none      Alcohol: none      Drugs: none      Exercise:  Some; sporadic in 2019.  Treadmill at gym three days per week.           Social History   Substance and Sexual Activity  Alcohol Use No   Social History   Tobacco Use  Smoking Status Never Smoker  Smokeless Tobacco Never Used   Social History   Substance and Sexual Activity  Drug Use No    GYN: Sexual Health Menstrual status: regular menses LMP: No LMP recorded. Last pap smear: see HM section History of abnormal pap smears:    Sexually active: with female partner Current contraception: none  Health Maintenance: See under health Maintenance activity for review of completion dates as well. Immunization History  Administered Date(s) Administered   Influenza,inj,Quad PF,6+ Mos 09/30/2014, 12/27/2016, 11/03/2018      Depression Screen-PHQ2/9 Depression screen [facility] 2/9 10/04/2019 07/31/2019 02/26/2019 02/21/2018 12/27/2016  Decreased Interest 0 0 0 0 0  Down, Depressed, Hopeless 0 0 0 0 0  PHQ - 2 Score 0 0 0 0 0  Altered sleeping - - 0 - -  Tired, decreased energy - - 0 - -  Change in appetite - - 0 - -  Feeling bad or failure about yourself  - - 0 - -  Trouble concentrating - - 0 - -  Moving slowly or fidgety/restless - - 0 - -  Suicidal thoughts - - 0 - -  PHQ-9 Score - - 0 - -  Difficult doing work/chores - - Not difficult at all - -       Depression Severity and Treatment Recommendations:  0-4= None  5-9= Mild / Treatment: Support, educate to call if worse; return in one month  10-14= Moderate / Treatment: Support, watchful waiting; Antidepressant or Psycotherapy  15-19= Moderately severe / Treatment: Antidepressant OR Psychotherapy  >= 20 = Major depression, severe / Antidepressant AND Psychotherapy    Review of Systems   Review of Systems  Constitutional: Negative for chills, fever and weight loss.  HENT: Negative for congestion, nosebleeds and sinus pain.   Eyes: Negative for blurred vision, double vision and photophobia.  Respiratory: Negative for cough, shortness of breath and wheezing.   Cardiovascular: Negative for chest pain and palpitations.  Gastrointestinal: Negative for abdominal pain, nausea and vomiting.  Genitourinary: Negative for dysuria, frequency and urgency.  Musculoskeletal: Negative for back pain, myalgias and neck pain.  Skin: Negative for itching and rash.  Neurological: Negative for dizziness, tingling and headaches.  Endo/Heme/Allergies: Negative for polydipsia. Does  not bruise/bleed easily.  Psychiatric/Behavioral: Negative for depression. The patient is not nervous/anxious and does not have insomnia.     See HPI for ROS as well.    Objective:   Vitals:   10/04/19 0808  BP: 123/85  Pulse: (!) 103  Resp: 18  Temp: 98.4 F (36.9 C)  TempSrc: Oral  SpO2: 98%  Weight: 245 lb (111.1 kg)  Height: 5' 4.5" (1.638 m)    Body mass index is 41.4  kg/m.  Chaperone Present  Physical Exam Constitutional:      Appearance: Normal appearance. She is obese.  HENT:     Head: Normocephalic and atraumatic.     Right Ear: Tympanic membrane normal.     Left Ear: Tympanic membrane normal.     Nose: Nose normal. No congestion.  Eyes:     Extraocular Movements: Extraocular movements intact.     Conjunctiva/sclera: Conjunctivae normal.  Neck:     Musculoskeletal: Normal range of motion and neck supple.     Comments: No thyromegaly Cardiovascular:     Rate and Rhythm: Normal rate and regular rhythm.     Pulses: Normal pulses.     Heart sounds: No murmur.  Pulmonary:     Effort: Pulmonary effort is normal. No respiratory distress.     Breath sounds: Normal breath sounds. No stridor. No wheezing, rhonchi or rales.  Chest:     Chest wall: No tenderness.     Breasts:        Right: Normal. No swelling, bleeding, inverted nipple, mass or nipple discharge.        Left: Normal. No swelling, bleeding, inverted nipple, mass or nipple discharge.  Abdominal:     General: Abdomen is flat. Bowel sounds are normal. There is no distension.     Palpations: Abdomen is soft. There is no mass.     Tenderness: There is no abdominal tenderness. There is no right CVA tenderness, left CVA tenderness, guarding or rebound.     Hernia: No hernia is present.  Genitourinary:    Vagina: Normal. No vaginal discharge, erythema, tenderness, bleeding or lesions.     Cervix: No cervical motion tenderness, discharge, friability, lesion or erythema.     Uterus: Normal.      Adnexa:  Right adnexa normal and left adnexa normal.       Right: No mass, tenderness or fullness.         Left: No mass, tenderness or fullness.    Musculoskeletal: Normal range of motion.        General: No swelling.  Skin:    General: Skin is warm.     Capillary Refill: Capillary refill takes less than 2 seconds.  Neurological:     General: No focal deficit present.     Mental Status: She is alert and oriented to person, place, and time.  Psychiatric:        Mood and Affect: Mood normal.        Behavior: Behavior normal.        Thought Content: Thought content normal.        Judgment: Judgment normal.        Assessment/Plan:   Patient was seen for a health maintenance exam.  Counseled the patient on health maintenance issues. Reviewed her health mainteance schedule and ordered appropriate tests (see orders.) Counseled on regular exercise and weight management. Recommend regular eye exams and dental cleaning.   The following issues were addressed today for health maintenance:   Kristine Mcdonald was seen today for annual exam.  Diagnoses and all orders for this visit:  Health maintenance examination- Women's Health Maintenance Plan Advised monthly breast exam and annual mammogram Advised dental exam every six months Discussed stress management Discussed pap smear screening guidelines    Special screening for malignant neoplasms, colon- -discussed options for colon cancer screenings, reviewed family history, discussed options for testing.  -pt decided to decline/proceed with cologuard/colonoscopy/FOBT  -     Ambulatory referral to Gastroenterology  Screening for cervical cancer- Discussed cervical cancer screening  As well as screening for HPV Discussed risk factors for cervical cancer  And hpv vaccination records reviewed  -     Cytology - PAP  Encounter for breast cancer screening using non-mammogram modality -     MM Digital Screening; Future  Need for prophylactic  vaccination and inoculation against influenza -     Flu Vaccine QUAD 6+ mos PF IM (Fluarix Quad PF) pt will get flu vaccine when she returns from out of town  Morbid obesity with BMI of 40.0-44.9, adult (HCC)   Return in about 3 months (around 01/04/2020) for prediabetes.    Body mass index is 41.4 kg/m.:  Discussed the patient's BMI with patient. The BMI body mass index is 41.4 kg/m.     Future Appointments  Date Time Provider Department Center  01/10/2020  3:00 PM Doristine Bosworth, MD PCP-PCP [facility]    Patient Instructions   For resources on weight loss and healthy eating [email or web address] and [email or web address].org  Mammogram We recommend that you schedule a mammogram for breast cancer screening. Typically, you do not need a referral to do this. Please contact a local imaging center to schedule your mammogram.   [facility]) - [phone] or [phone]    If you have lab work done today you will be contacted with your lab results within the next 2 weeks.  If you have not heard from [place] then please contact us. The fastest way to get your results is to register for My Chart.   IF you received an x-ray today, you will receive an invoice from [facility]. Please contact [facility] at [phone] with questions or concerns regarding your invoice.   IF you received labwork today, you will receive an invoice from [place]. Please contact [facility] at [phone] with questions or concerns regarding your invoice.   Our billing staff will not be able to assist you with questions regarding bills from these companies.  You will be contacted with the lab results as soon as they are available. The fastest way to get your results is to activate your My Chart account. Instructions are located on the last page of this paperwork. If you have not heard from [place] regarding the results in 2 weeks, please contact this office.      Prediabetes Prediabetes  is the condition of having a blood sugar (blood glucose) level that is higher than it should be, but not high enough for you to be diagnosed with type 2 diabetes. Having prediabetes puts you at risk for developing type 2 diabetes (type 2 diabetes mellitus). Prediabetes may be called impaired glucose tolerance or impaired fasting glucose. Prediabetes usually does not cause symptoms. Your health care provider can diagnose this condition with blood tests. You may be tested for prediabetes if you are overweight and if you have at least one other risk factor for prediabetes. What is blood glucose, and how is it measured? Blood glucose refers to the amount of glucose in your bloodstream. Glucose comes from eating foods that contain sugars and starches (carbohydrates), which the body breaks down into glucose. Your blood glucose level may be measured in mg/dL (milligrams per deciliter) or mmol/L (millimoles per liter). Your blood glucose may be checked with one or more of the following blood tests:  A fasting blood glucose (FBG) test. You will not be allowed to eat (you will fast) for 8 hours or longer before a blood sample  is taken. ? A normal range for FBG is 70-100 mg/dl (1.6-1.0 mmol/L).  An A1c (hemoglobin A1c) blood test. This test provides information about blood glucose control over the previous 2?3months.  An oral glucose tolerance test (OGTT). This test measures your blood glucose at two times: ? After fasting. This is your baseline level. ? Two hours after you drink a beverage that contains glucose. You may be diagnosed with prediabetes:  If your FBG is 100?125 mg/dL (9.6-0.4 mmol/L).  If your A1c level is 5.7?6.4%.  If your OGTT result is 140?199 mg/dL (5.4-09 mmol/L). These blood tests may be repeated to confirm your diagnosis. How can this condition affect me? The pancreas produces a hormone (insulin) that helps to move glucose from the bloodstream into cells. When cells in the body do  not respond properly to insulin that the body makes (insulin resistance), excess glucose builds up in the blood instead of going into cells. As a result, high blood glucose (hyperglycemia) can develop, which can cause many complications. Hyperglycemia is a symptom of prediabetes. Having high blood glucose for a long time is dangerous. Too much glucose in your blood can damage your nerves and blood vessels. Long-term damage can lead to complications from diabetes, which may include:  Heart disease.  Stroke.  Blindness.  Kidney disease.  Depression.  Poor circulation in the feet and legs, which could lead to surgical removal (amputation) in severe cases. What can increase my risk? Risk factors for prediabetes include:  Having a family member with type 2 diabetes.  Being overweight or obese.  Being older than age 46.  Being of American [place], African-American, Hispanic/Latino, or Asian/Pacific Islander descent.  Having an inactive (sedentary) lifestyle.  Having a history of heart disease.  History of gestational diabetes or polycystic ovary syndrome (PCOS), in women.  Having low levels of good cholesterol (HDL-C) or high levels of blood fats (triglycerides).  Having high blood pressure. What actions can I take to prevent diabetes?      Be physically active. ? Do moderate-intensity physical activity for 30 or more minutes on 5 or more days of the week, or as much as told by your health care provider. This could be brisk walking, biking, or water aerobics. ? Ask your health care provider what activities are safe for you. A mix of physical activities may be best, such as walking, swimming, cycling, and strength training.  Lose weight as told by your health care provider. ? Losing 5-7% of your body weight can reverse insulin resistance. ? Your health care provider can determine how much weight loss is best for you and can help you lose weight safely.  Follow a healthy meal  plan. This includes eating lean proteins, complex carbohydrates, fresh fruits and vegetables, low-fat dairy products, and healthy fats. ? Follow instructions from your health care provider about eating or drinking restrictions. ? Make an appointment to see a diet and nutrition specialist (registered dietitian) to help you create a healthy eating plan that is right for you.  Do not smoke or use any tobacco products, such as cigarettes, chewing tobacco, and e-cigarettes. If you need help quitting, ask your health care provider.  Take over-the-counter and prescription medicines as told by your health care provider. You may be prescribed medicines that help lower the risk of type 2 diabetes.  Keep all follow-up visits as told by your health care provider. This is important. Summary  Prediabetes is the condition of having a blood sugar (blood glucose) level  that is higher than it should be, but not high enough for you to be diagnosed with type 2 diabetes.  Having prediabetes puts you at risk for developing type 2 diabetes (type 2 diabetes mellitus).  To help prevent type 2 diabetes, make lifestyle changes such as being physically active and eating a healthy diet. Lose weight as told by your health care provider. This information is not intended to replace advice given to you by your health care provider. Make sure you discuss any questions you have with your health care provider. Document Released: 04/04/2016 Document Revised: 04/05/2019 Document Reviewed: 02/02/2016 Elsevier Patient Education  2020 [employer].

## 2019-10-04 NOTE — Patient Instructions (Addendum)
For resources on weight loss and healthy eating Nutrition.org and diabetes.org  Mammogram We recommend that you schedule a mammogram for breast cancer screening. Typically, you do not need a referral to do this. Please contact a local imaging center to schedule your mammogram.   [facility]) - [phone] or [phone]    If you have lab work done today you will be contacted with your lab results within the next 2 weeks.  If you have not heard from [place] then please contact us. The fastest way to get your results is to register for My Chart.   IF you received an x-ray today, you will receive an invoice from [facility]. Please contact [facility] at [phone] with questions or concerns regarding your invoice.   IF you received labwork today, you will receive an invoice from [facility]. Please contact [facility] at [phone] with questions or concerns regarding your invoice.   Our billing staff will not be able to assist you with questions regarding bills from these companies.  You will be contacted with the lab results as soon as they are available. The fastest way to get your results is to activate your My Chart account. Instructions are located on the last page of this paperwork. If you have not heard from [place] regarding the results in 2 weeks, please contact this office.      Prediabetes Prediabetes is the condition of having a blood sugar (blood glucose) level that is higher than it should be, but not high enough for you to be diagnosed with type 2 diabetes. Having prediabetes puts you at risk for developing type 2 diabetes (type 2 diabetes mellitus). Prediabetes may be called impaired glucose tolerance or impaired fasting glucose. Prediabetes usually does not cause symptoms. Your health care provider can diagnose this condition with blood tests. You may be tested for prediabetes if you are overweight and if you have at least one other  risk factor for prediabetes. What is blood glucose, and how is it measured? Blood glucose refers to the amount of glucose in your bloodstream. Glucose comes from eating foods that contain sugars and starches (carbohydrates), which the body breaks down into glucose. Your blood glucose level may be measured in mg/dL (milligrams per deciliter) or mmol/L (millimoles per liter). Your blood glucose may be checked with one or more of the following blood tests:  A fasting blood glucose (FBG) test. You will not be allowed to eat (you will fast) for 8 hours or longer before a blood sample is taken. ? A normal range for FBG is 70-100 mg/dl (9.3-2.3 mmol/L).  An A1c (hemoglobin A1c) blood test. This test provides information about blood glucose control over the previous 2?25months.  An oral glucose tolerance test (OGTT). This test measures your blood glucose at two times: ? After fasting. This is your baseline level. ? Two hours after you drink a beverage that contains glucose. You may be diagnosed with prediabetes:  If your FBG is 100?125 mg/dL (5.5-7.3 mmol/L).  If your A1c level is 5.7?6.4%.  If your OGTT result is 140?199 mg/dL (2.2-02 mmol/L). These blood tests may be repeated to confirm your diagnosis. How can this condition affect me? The pancreas produces a hormone (insulin) that helps to move glucose from the bloodstream into cells. When cells in the body do not respond properly to insulin that the body makes (insulin resistance), excess glucose builds up in the blood instead of going into cells. As a result, high blood  glucose (hyperglycemia) can develop, which can cause many complications. Hyperglycemia is a symptom of prediabetes. Having high blood glucose for a long time is dangerous. Too much glucose in your blood can damage your nerves and blood vessels. Long-term damage can lead to complications from diabetes, which may include:  Heart disease.  Stroke.  Blindness.  Kidney  disease.  Depression.  Poor circulation in the feet and legs, which could lead to surgical removal (amputation) in severe cases. What can increase my risk? Risk factors for prediabetes include:  Having a family member with type 2 diabetes.  Being overweight or obese.  Being older than age 19.  Being of American [place], African-American, Hispanic/Latino, or Asian/Pacific Islander descent.  Having an inactive (sedentary) lifestyle.  Having a history of heart disease.  History of gestational diabetes or polycystic ovary syndrome (PCOS), in women.  Having low levels of good cholesterol (HDL-C) or high levels of blood fats (triglycerides).  Having high blood pressure. What actions can I take to prevent diabetes?      Be physically active. ? Do moderate-intensity physical activity for 30 or more minutes on 5 or more days of the week, or as much as told by your health care provider. This could be brisk walking, biking, or water aerobics. ? Ask your health care provider what activities are safe for you. A mix of physical activities may be best, such as walking, swimming, cycling, and strength training.  Lose weight as told by your health care provider. ? Losing 5-7% of your body weight can reverse insulin resistance. ? Your health care provider can determine how much weight loss is best for you and can help you lose weight safely.  Follow a healthy meal plan. This includes eating lean proteins, complex carbohydrates, fresh fruits and vegetables, low-fat dairy products, and healthy fats. ? Follow instructions from your health care provider about eating or drinking restrictions. ? Make an appointment to see a diet and nutrition specialist (registered dietitian) to help you create a healthy eating plan that is right for you.  Do not smoke or use any tobacco products, such as cigarettes, chewing tobacco, and e-cigarettes. If you need help quitting, ask your health care provider.  Take  over-the-counter and prescription medicines as told by your health care provider. You may be prescribed medicines that help lower the risk of type 2 diabetes.  Keep all follow-up visits as told by your health care provider. This is important. Summary  Prediabetes is the condition of having a blood sugar (blood glucose) level that is higher than it should be, but not high enough for you to be diagnosed with type 2 diabetes.  Having prediabetes puts you at risk for developing type 2 diabetes (type 2 diabetes mellitus).  To help prevent type 2 diabetes, make lifestyle changes such as being physically active and eating a healthy diet. Lose weight as told by your health care provider. This information is not intended to replace advice given to you by your health care provider. Make sure you discuss any questions you have with your health care provider. Document Released: 04/04/2016 Document Revised: 04/05/2019 Document Reviewed: 02/02/2016 Elsevier Patient Education  2020 [employer].

## 2019-10-15 LAB — CYTOLOGY - PAP
Chlamydia: NEGATIVE
Comment: NEGATIVE
Comment: NEGATIVE
Comment: NORMAL
Diagnosis: NEGATIVE
High risk HPV: NEGATIVE
Neisseria Gonorrhea: NEGATIVE

## 2019-10-25 ENCOUNTER — Telehealth: Payer: Self-pay

## 2019-10-25 NOTE — Telephone Encounter (Signed)
Patient No showed for PV. Patient was called to reschedule her appointment. No answer. Message left on voicemail to call and reschedule before 5:00 Pm today. Patient was informed that her procedure would be cancelled if we do not hear from her before 5:00 Pm. A no show letter will be mailed at the end of the day.   Riki Sheer, LPN ( PV )

## 2019-11-08 ENCOUNTER — Telehealth: Payer: Self-pay | Admitting: Family Medicine

## 2019-11-08 ENCOUNTER — Encounter: Payer: Self-pay | Admitting: Gastroenterology

## 2019-11-08 NOTE — Telephone Encounter (Signed)
called pt LVM for her to c/b and reschedule appt for 01.15.2021 provider not available FR

## 2020-01-10 ENCOUNTER — Ambulatory Visit: Payer: BC Managed Care – PPO | Admitting: Family Medicine

## 2020-02-22 ENCOUNTER — Ambulatory Visit: Payer: BC Managed Care – PPO

## 2020-08-21 ENCOUNTER — Telehealth: Payer: Self-pay

## 2020-08-21 DIAGNOSIS — I10 Essential (primary) hypertension: Secondary | ICD-10-CM

## 2020-08-21 MED ORDER — LISINOPRIL-HYDROCHLOROTHIAZIDE 20-12.5 MG PO TABS: 1.0000 | ORAL_TABLET | Freq: Every day | ORAL | 0 refills | Status: DC

## 2020-08-21 NOTE — Telephone Encounter (Signed)
Pt. Called to schedule TOC with Kateri Plummer and request refills on lisinopril until her visit with the NP

## 2020-08-21 NOTE — Telephone Encounter (Signed)
[place] refill given until appointment date

## 2020-10-20 ENCOUNTER — Ambulatory Visit: Payer: BC Managed Care – PPO | Admitting: Registered Nurse

## 2020-10-20 ENCOUNTER — Other Ambulatory Visit: Payer: Self-pay

## 2020-10-20 ENCOUNTER — Encounter: Payer: Self-pay | Admitting: Registered Nurse

## 2020-10-20 VITALS — BP 123/83 | HR 95 | Temp 98.2°F | Resp 17 | Ht 64.5 in | Wt 248.4 lb

## 2020-10-20 DIAGNOSIS — Z1211 Encounter for screening for malignant neoplasm of colon: Secondary | ICD-10-CM | POA: Diagnosis not present

## 2020-10-20 DIAGNOSIS — Z0000 Encounter for general adult medical examination without abnormal findings: Secondary | ICD-10-CM

## 2020-10-20 DIAGNOSIS — Z1231 Encounter for screening mammogram for malignant neoplasm of breast: Secondary | ICD-10-CM | POA: Diagnosis not present

## 2020-10-20 NOTE — Patient Instructions (Signed)
° ° ° °  If you have lab work done today you will be contacted with your lab results within the next 2 weeks.  If you have not heard from us then please contact us. The fastest way to get your results is to register for My Chart. ° ° °IF you received an x-ray today, you will receive an invoice from [facility]. Please contact [facility] at [phone] with questions or concerns regarding your invoice.  ° °IF you received labwork today, you will receive an invoice from [facility]. Please contact [facility] at [phone] with questions or concerns regarding your invoice.  ° °Our billing staff will not be able to assist you with questions regarding bills from these companies. ° °You will be contacted with the lab results as soon as they are available. The fastest way to get your results is to activate your My Chart account. Instructions are located on the last page of this paperwork. If you have not heard from us regarding the results in 2 weeks, please contact this office. °  ° ° ° °

## 2020-10-21 LAB — CBC WITH DIFFERENTIAL/PLATELET
Basophils Absolute: 0.1 10*3/uL (ref 0.0–0.2)
Basos: 1 %
EOS (ABSOLUTE): 0.4 10*3/uL (ref 0.0–0.4)
Eos: 4 %
Hematocrit: 43.2 % (ref 34.0–46.6)
Hemoglobin: 14.7 g/dL (ref 11.1–15.9)
Immature Grans (Abs): 0 10*3/uL (ref 0.0–0.1)
Immature Granulocytes: 0 %
Lymphocytes Absolute: 3.5 10*3/uL — ABNORMAL HIGH (ref 0.7–3.1)
Lymphs: 33 %
MCH: 30.3 pg (ref 26.6–33.0)
MCHC: 34 g/dL (ref 31.5–35.7)
MCV: 89 fL (ref 79–97)
Monocytes Absolute: 0.7 10*3/uL (ref 0.1–0.9)
Monocytes: 7 %
Neutrophils Absolute: 6 10*3/uL (ref 1.4–7.0)
Neutrophils: 55 %
Platelets: 425 10*3/uL (ref 150–450)
RBC: 4.85 x10E6/uL (ref 3.77–5.28)
RDW: 12.6 % (ref 11.7–15.4)
WBC: 10.8 10*3/uL (ref 3.4–10.8)

## 2020-10-21 LAB — LIPID PANEL
Chol/HDL Ratio: 3.6 ratio (ref 0.0–4.4)
Cholesterol, Total: 174 mg/dL (ref 100–199)
HDL: 48 mg/dL (ref >39)
LDL Chol Calc (NIH): 113 mg/dL — ABNORMAL HIGH (ref 0–99)
Triglycerides: 68 mg/dL (ref 0–149)
VLDL Cholesterol Cal: 13 mg/dL (ref 5–40)

## 2020-10-21 LAB — TSH: TSH: 0.688 u[IU]/mL (ref 0.450–4.500)

## 2020-10-21 LAB — COMPREHENSIVE METABOLIC PANEL
ALT: 24 IU/L (ref 0–32)
AST: 14 IU/L (ref 0–40)
Albumin/Globulin Ratio: 1.7 (ref 1.2–2.2)
Albumin: 4.3 g/dL (ref 3.8–4.9)
Alkaline Phosphatase: 90 IU/L (ref 44–121)
BUN/Creatinine Ratio: 10 (ref 9–23)
BUN: 10 mg/dL (ref 6–24)
Bilirubin Total: 0.3 mg/dL (ref 0.0–1.2)
CO2: 28 mmol/L (ref 20–29)
Calcium: 9.7 mg/dL (ref 8.7–10.2)
Chloride: 95 mmol/L — ABNORMAL LOW (ref 96–106)
Creatinine, Ser: 0.98 mg/dL (ref 0.57–1.00)
GFR calc Af Amer: 77 mL/min/{1.73_m2} (ref >59)
GFR calc non Af Amer: 67 mL/min/{1.73_m2} (ref >59)
Globulin, Total: 2.6 g/dL (ref 1.5–4.5)
Glucose: 73 mg/dL (ref 65–99)
Potassium: 4.2 mmol/L (ref 3.5–5.2)
Sodium: 136 mmol/L (ref 134–144)
Total Protein: 6.9 g/dL (ref 6.0–8.5)

## 2020-10-21 LAB — HEMOGLOBIN A1C
Est. average glucose Bld gHb Est-mCnc: 137 mg/dL
Hgb A1c MFr Bld: 6.4 % — ABNORMAL HIGH (ref 4.8–5.6)

## 2020-10-23 ENCOUNTER — Other Ambulatory Visit: Payer: Self-pay

## 2020-10-23 ENCOUNTER — Ambulatory Visit (INDEPENDENT_AMBULATORY_CARE_PROVIDER_SITE_OTHER): Payer: BC Managed Care – PPO | Admitting: Registered Nurse

## 2020-10-23 DIAGNOSIS — Z111 Encounter for screening for respiratory tuberculosis: Secondary | ICD-10-CM

## 2020-10-23 DIAGNOSIS — Z23 Encounter for immunization: Secondary | ICD-10-CM

## 2020-10-23 MED ORDER — TETANUS-DIPHTHERIA TOXOIDS TD 5-2 LFU IM INJ
0.5000 mL | INJECTION | Freq: Once | INTRAMUSCULAR | Status: AC
  Administered 2020-10-23: 0.5 mL via INTRAMUSCULAR

## 2020-11-12 ENCOUNTER — Other Ambulatory Visit: Payer: Self-pay | Admitting: Registered Nurse

## 2020-11-12 DIAGNOSIS — I10 Essential (primary) hypertension: Secondary | ICD-10-CM

## 2020-11-27 LAB — COLOGUARD
COLOGUARD: NEGATIVE
Cologuard: NEGATIVE

## 2021-01-07 ENCOUNTER — Encounter: Payer: Self-pay | Admitting: Registered Nurse

## 2021-01-07 NOTE — Progress Notes (Signed)
Established Patient Office Visit  Subjective:  Patient ID: Kristine Mcdonald, female    DOB: [birth year]  Age: 50 y.o. MRN: [id number]  CC:  Chief Complaint  Patient presents with  . Transitions Of Care    HPI Kristine Mcdonald presents for [facility]  Former [facility] pt  Histories reviewed, updated as warranted  Pt due for some health maintenance items, will place orders today  Pt requests routine labs.   Hx of htn, well controlled with lisionpril-hctz 20-12.5mg  PO qd. No concerns at this time. No cv symptoms.  Past Medical History:  Diagnosis Date  . Hypertension     Past Surgical History:  Procedure Laterality Date  . APPENDECTOMY      Family History  Problem Relation Age of Onset  . Hypertension Mother   . Arthritis Father     Social History   Socioeconomic History  . Marital status: Single    Spouse name: Not on file  . Number of children: 3  . Years of education: Not on file  . Highest education level: Not on file  Occupational History  . Not on file  Tobacco Use  . Smoking status: Never Smoker  . Smokeless tobacco: Never Used  Vaping Use  . Vaping Use: Never used  Substance and Sexual Activity  . Alcohol use: No  . Drug use: No  . Sexual activity: Never  Other Topics Concern  . Not on file  Social History Narrative   Marital status: separated in 2019 after 14 years of marriage.  Not dating in 2021.      Children: 3 children (26, 23, 21); no grandchildren.      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      Tobacco: none      Alcohol: none      Drugs: none      Exercise:  Some; sporadic in 2019.  Treadmill at gym three days per week.           Social Determinants of Health   Financial Resource Strain: Not on file  Food Insecurity: Not on file  Transportation Needs: Not on file  Physical Activity: Not on file  Stress: Not on file  Social Connections:  Not on file  Intimate Partner Violence: Not on file    Outpatient Medications Prior to Visit  Medication Sig Dispense Refill  . lisinopril-hydrochlorothiazide (ZESTORETIC) 20-12.5 MG tablet Take 1 tablet by mouth daily. 90 tablet 0   No facility-administered medications prior to visit.    Allergies  Allergen Reactions  . Iohexol      Code: HIVES, Desc: hives/rash/leg & arm pain a few hours after iv contrast administration- pt admitted- benadryl & solu-medrol given- 13-hr pre-meds required for future iv contrast administration, Onset Date: [id number]     ROS Review of Systems  Constitutional: Negative.   HENT: Negative.   Eyes: Negative.   Respiratory: Negative.   Cardiovascular: Negative.   Gastrointestinal: Negative.   Genitourinary: Negative.   Musculoskeletal: Negative.   Skin: Negative.   Neurological: Negative.   Psychiatric/Behavioral: Negative.   All other systems reviewed and are negative.     Objective:    Physical Exam Vitals and nursing note reviewed.  Constitutional:      General: She is not in acute distress.    Appearance: Normal appearance. She is normal weight. She is not ill-appearing, toxic-appearing or  diaphoretic.  Cardiovascular:     Rate and Rhythm: Normal rate and regular rhythm.     Heart sounds: Normal heart sounds. No murmur heard. No friction rub. No gallop.   Pulmonary:     Effort: Pulmonary effort is normal. No respiratory distress.     Breath sounds: Normal breath sounds. No stridor. No wheezing, rhonchi or rales.  Chest:     Chest wall: No tenderness.  Skin:    General: Skin is warm and dry.  Neurological:     General: No focal deficit present.     Mental Status: She is alert and oriented to person, place, and time. Mental status is at baseline.  Psychiatric:        Mood and Affect: Mood normal.        Behavior: Behavior normal.        Thought Content: Thought content normal.        Judgment: Judgment normal.     BP 123/83    Pulse 95   Temp 98.2 F (36.8 C) (Temporal)   Resp 17   Ht 5' 4.5" (1.638 m)   Wt 248 lb 6.4 oz (112.7 kg)   SpO2 98%   BMI 41.98 kg/m  Wt Readings from Last 3 Encounters:  10/20/20 248 lb 6.4 oz (112.7 kg)  10/04/19 245 lb (111.1 kg)  07/31/19 247 lb 6.4 oz (112.2 kg)     Health Maintenance Due  Topic Date Due  . Hepatitis C Screening  Never done  . MAMMOGRAM  Never done  . COVID-19 Vaccine (3 - Booster for Pfizer series) 09/23/2020    There are no preventive care reminders to display for this patient.  Lab Results  Component Value Date   TSH 0.688 10/20/2020   Lab Results  Component Value Date   WBC 10.8 10/20/2020   HGB 14.7 10/20/2020   HCT 43.2 10/20/2020   MCV 89 10/20/2020   PLT 425 10/20/2020   Lab Results  Component Value Date   NA 136 10/20/2020   K 4.2 10/20/2020   CO2 28 10/20/2020   GLUCOSE 73 10/20/2020   BUN 10 10/20/2020   CREATININE 0.98 10/20/2020   BILITOT 0.3 10/20/2020   ALKPHOS 90 10/20/2020   AST 14 10/20/2020   ALT 24 10/20/2020   PROT 6.9 10/20/2020   ALBUMIN 4.3 10/20/2020   CALCIUM 9.7 10/20/2020   Lab Results  Component Value Date   CHOL 174 10/20/2020   Lab Results  Component Value Date   HDL 48 10/20/2020   Lab Results  Component Value Date   LDLCALC 113 (H) 10/20/2020   Lab Results  Component Value Date   TRIG 68 10/20/2020   Lab Results  Component Value Date   CHOLHDL 3.6 10/20/2020   Lab Results  Component Value Date   HGBA1C 6.4 (H) 10/20/2020      Assessment & Plan:   Problem List Items Addressed This Visit   None   Visit Diagnoses    Encounter for screening mammogram for malignant neoplasm of breast    -  Primary   Relevant Orders   MM Digital Diagnostic Bilat   Screening for colon cancer       Relevant Orders   Cologuard (Completed)   Routine general medical examination at a health care facility       Relevant Orders   Hemoglobin A1c (Completed)   Lipid panel (Completed)    Comprehensive metabolic panel (Completed)   CBC with Differential (Completed)  TSH (Completed)      No orders of the defined types were placed in this encounter.   Follow-up: No follow-ups on file.   PLAN  Labs collected. Will follow up with the patient as warranted.  cologuard ordered  Mammography orders sent  Return annually  Patient encouraged to call clinic with any questions, comments, or concerns.  Janeece Agee, NP

## 2021-02-16 ENCOUNTER — Other Ambulatory Visit: Payer: Self-pay | Admitting: Registered Nurse

## 2021-02-16 DIAGNOSIS — I10 Essential (primary) hypertension: Secondary | ICD-10-CM

## 2021-02-16 NOTE — Telephone Encounter (Signed)
Requested Prescriptions  Pending Prescriptions Disp Refills  . lisinopril-hydrochlorothiazide (ZESTORETIC) 20-12.5 MG tablet [Pharmacy Med Name: LISINOPRIL-HCTZ 20-12.5 MG TAB] 90 tablet 1    Sig: TAKE 1 TABLET BY MOUTH EVERY DAY     Cardiovascular:  ACEI + Diuretic Combos Passed - 02/16/2021  1:30 AM      Passed - Na in normal range and within 180 days    Sodium  Date Value Ref Range Status  10/20/2020 136 134 - 144 mmol/L Final         Passed - K in normal range and within 180 days    Potassium  Date Value Ref Range Status  10/20/2020 4.2 3.5 - 5.2 mmol/L Final         Passed - Cr in normal range and within 180 days    Creat  Date Value Ref Range Status  09/15/2013 1.46 (H) 0.50 - 1.10 mg/dL Final   Creatinine, Ser  Date Value Ref Range Status  10/20/2020 0.98 0.57 - 1.00 mg/dL Final         Passed - Ca in normal range and within 180 days    Calcium  Date Value Ref Range Status  10/20/2020 9.7 8.7 - 10.2 mg/dL Final         Passed - Patient is not pregnant      Passed - Last BP in normal range    BP Readings from Last 1 Encounters:  10/20/20 123/83         Passed - Valid encounter within last 6 months    Recent Outpatient Visits          3 months ago Encounter for screening mammogram for malignant neoplasm of breast   [facility], Gerlene Burdock, NP   1 year ago Health maintenance examination   Primary Care at [facility], Manus Rudd, MD   1 year ago Screening, lipid   Primary Care at [facility], Zoe A, MD   1 year ago Acute conjunctivitis of both eyes, unspecified acute conjunctivitis type   Primary Care at [facility], Eilleen Kempf, MD   2 years ago Essential hypertension   Primary Care at [facility], Godfrey Pick, FNP

## 2021-08-18 ENCOUNTER — Other Ambulatory Visit: Payer: Self-pay | Admitting: Registered Nurse

## 2021-08-18 DIAGNOSIS — I10 Essential (primary) hypertension: Secondary | ICD-10-CM

## 2021-12-29 ENCOUNTER — Encounter: Payer: Self-pay | Admitting: Registered Nurse

## 2021-12-30 NOTE — Telephone Encounter (Signed)
Please Advise patient has some concerns about Lisinopril.  I have just been told by a friend who almost died from a reaction to lisinopril that black people should not be prescribed this medicine. A doctor told her this. I would like to have my medicine changed to something else. What do I need to do to make that happen?

## 2021-12-30 NOTE — Telephone Encounter (Signed)
Has not been seen in >1 year, should have OV  Thanks,  Retail banker

## 2022-01-02 ENCOUNTER — Encounter: Payer: Self-pay | Admitting: Registered Nurse

## 2022-01-03 NOTE — Telephone Encounter (Signed)
Patient is scheduled for 01/10/2022 pm.

## 2022-01-10 ENCOUNTER — Encounter: Payer: Self-pay | Admitting: Registered Nurse

## 2022-01-10 ENCOUNTER — Other Ambulatory Visit: Payer: Self-pay | Admitting: Registered Nurse

## 2022-01-10 ENCOUNTER — Ambulatory Visit: Payer: BC Managed Care – PPO | Admitting: Registered Nurse

## 2022-01-10 VITALS — BP 120/68 | HR 98 | Temp 98.3°F | Resp 16 | Ht 65.0 in | Wt 256.8 lb

## 2022-01-10 DIAGNOSIS — Z1329 Encounter for screening for other suspected endocrine disorder: Secondary | ICD-10-CM | POA: Diagnosis not present

## 2022-01-10 DIAGNOSIS — Z1231 Encounter for screening mammogram for malignant neoplasm of breast: Secondary | ICD-10-CM | POA: Diagnosis not present

## 2022-01-10 DIAGNOSIS — E119 Type 2 diabetes mellitus without complications: Secondary | ICD-10-CM

## 2022-01-10 DIAGNOSIS — Z13220 Encounter for screening for lipoid disorders: Secondary | ICD-10-CM

## 2022-01-10 DIAGNOSIS — Z13228 Encounter for screening for other metabolic disorders: Secondary | ICD-10-CM

## 2022-01-10 DIAGNOSIS — I10 Essential (primary) hypertension: Secondary | ICD-10-CM

## 2022-01-10 DIAGNOSIS — Z130 Encounter for screening for diseases of the blood and blood-forming organs and certain disorders involving the immune mechanism: Secondary | ICD-10-CM

## 2022-01-10 LAB — COMPREHENSIVE METABOLIC PANEL
ALT: 24 U/L (ref 0–35)
AST: 17 U/L (ref 0–37)
Albumin: 4.3 g/dL (ref 3.5–5.2)
Alkaline Phosphatase: 84 U/L (ref 39–117)
BUN: 14 mg/dL (ref 6–23)
CO2: 30 mEq/L (ref 19–32)
Calcium: 9.5 mg/dL (ref 8.4–10.5)
Chloride: 96 mEq/L (ref 96–112)
Creatinine, Ser: 0.95 mg/dL (ref 0.40–1.20)
GFR: 68.98 mL/min (ref >60.00)
Glucose, Bld: 73 mg/dL (ref 70–99)
Potassium: 4 mEq/L (ref 3.5–5.1)
Sodium: 136 mEq/L (ref 135–145)
Total Bilirubin: 0.8 mg/dL (ref 0.2–1.2)
Total Protein: 7.2 g/dL (ref 6.0–8.3)

## 2022-01-10 LAB — LIPID PANEL
Cholesterol: 197 mg/dL (ref 0–200)
HDL: 45.8 mg/dL (ref >39.00)
LDL Cholesterol: 124 mg/dL — ABNORMAL HIGH (ref 0–99)
NonHDL: 150.91
Total CHOL/HDL Ratio: 4
Triglycerides: 133 mg/dL (ref 0.0–149.0)
VLDL: 26.6 mg/dL (ref 0.0–40.0)

## 2022-01-10 LAB — CBC WITH DIFFERENTIAL/PLATELET
Basophils Absolute: 0 10*3/uL (ref 0.0–0.1)
Basophils Relative: 0.3 % (ref 0.0–3.0)
Eosinophils Absolute: 0.3 10*3/uL (ref 0.0–0.7)
Eosinophils Relative: 3.5 % (ref 0.0–5.0)
HCT: 44.2 % (ref 36.0–46.0)
Hemoglobin: 14.4 g/dL (ref 12.0–15.0)
Lymphocytes Relative: 30.5 % (ref 12.0–46.0)
Lymphs Abs: 3 10*3/uL (ref 0.7–4.0)
MCHC: 32.6 g/dL (ref 30.0–36.0)
MCV: 89.9 fl (ref 78.0–100.0)
Monocytes Absolute: 0.5 10*3/uL (ref 0.1–1.0)
Monocytes Relative: 5.5 % (ref 3.0–12.0)
Neutro Abs: 5.9 10*3/uL (ref 1.4–7.7)
Neutrophils Relative %: 60.2 % (ref 43.0–77.0)
Platelets: 393 10*3/uL (ref 150.0–400.0)
RBC: 4.91 Mil/uL (ref 3.87–5.11)
RDW: 13.9 % (ref 11.5–15.5)
WBC: 9.7 10*3/uL (ref 4.0–10.5)

## 2022-01-10 LAB — TSH: TSH: 0.74 u[IU]/mL (ref 0.35–5.50)

## 2022-01-10 LAB — HEMOGLOBIN A1C: Hgb A1c MFr Bld: 7.2 % — ABNORMAL HIGH (ref 4.6–6.5)

## 2022-01-10 MED ORDER — BLOOD PRESSURE CUFF MISC: 1.0000 | Freq: Once | 0 refills | Status: AC

## 2022-01-10 MED ORDER — METFORMIN HCL 500 MG PO TABS: 500.0000 mg | ORAL_TABLET | Freq: Two times a day (BID) | ORAL | 1 refills | Status: DC

## 2022-01-10 MED ORDER — ATENOLOL-CHLORTHALIDONE 50-25 MG PO TABS: 1.0000 | ORAL_TABLET | Freq: Every day | ORAL | 1 refills | Status: DC

## 2022-01-10 MED ORDER — ROSUVASTATIN CALCIUM 5 MG PO TABS: 5.0000 mg | ORAL_TABLET | Freq: Every day | ORAL | 3 refills | Status: DC

## 2022-01-10 NOTE — Progress Notes (Signed)
Established Patient Office Visit  Subjective:  Patient ID: Kristine Mcdonald, female    DOB: Mar 07, 1971  Age: 51 y.o. MRN: [id number]  CC:  Chief Complaint  Patient presents with   Follow-up    HPI KANEKA BRONSON presents for htn  Hypertension: Patient Currently taking: lisinopril-hctz 20-12.5mg  po qd Good effect. No AEs. Denies CV symptoms including: chest pain, shob, doe, headache, visual changes, fatigue, claudication, and dependent edema.   Previous readings and labs: BP Readings from Last 3 Encounters:  01/10/22 120/68  10/20/20 123/83  10/04/19 123/85   Lab Results  Component Value Date   CREATININE 0.98 10/20/2020    Notes she has heard that lisinopril has had adverse events in black americans in the past. She is interested in switching medications.   Notes she would like to avoid any risk for hair loss / alopecia with new agent  Had home bp cuff but it stopped working.   Due for mammogram, would like order placed.   Past Medical History:  Diagnosis Date   Hypertension     Past Surgical History:  Procedure Laterality Date   APPENDECTOMY      Family History  Problem Relation Age of Onset   Hypertension Mother    Arthritis Father     Social History   Socioeconomic History   Marital status: Single    Spouse name: Not on file   Number of children: 3   Years of education: Not on file   Highest education level: Not on file  Occupational History   Not on file  Tobacco Use   Smoking status: Never   Smokeless tobacco: Never  Vaping Use   Vaping Use: Never used  Substance and Sexual Activity   Alcohol use: No   Drug use: No   Sexual activity: Never  Other Topics Concern   Not on file  Social History Narrative   Marital status: separated in 2019 after 14 years of marriage.  Not dating in 2021.      Children: 3 children (26, 23, 21); no grandchildren.      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      Tobacco: none      Alcohol: none      Drugs: none      Exercise:  Some; sporadic in 2019.  Treadmill at gym three days per week.           Social Determinants of Health   Financial Resource Strain: Not on file  Food Insecurity: Not on file  Transportation Needs: Not on file  Physical Activity: Not on file  Stress: Not on file  Social Connections: Not on file  Intimate Partner Violence: Not on file    Outpatient Medications Prior to Visit  Medication Sig Dispense Refill   lisinopril-hydrochlorothiazide (ZESTORETIC) 20-12.5 MG tablet TAKE 1 TABLET BY MOUTH EVERY DAY 90 tablet 1   No facility-administered medications prior to visit.    Allergies  Allergen Reactions   Iohexol      Code: HIVES, Desc: hives/rash/leg & arm pain a few hours after iv contrast administration- pt admitted- benadryl & solu-medrol given- 13-hr pre-meds required for future iv contrast administration, Onset Date: [id number]     ROS Review of Systems    Objective:    Physical Exam  BP 120/68    Pulse 98    Temp 98.3  F (36.8 C)    Resp 16    Ht 5\' 5"  (1.651 m)    Wt 256 lb 12.8 oz (116.5 kg)    SpO2 99%    BMI 42.73 kg/m  Wt Readings from Last 3 Encounters:  01/10/22 256 lb 12.8 oz (116.5 kg)  10/20/20 248 lb 6.4 oz (112.7 kg)  10/04/19 245 lb (111.1 kg)     Health Maintenance Due  Topic Date Due   Hepatitis C Screening  Never done   MAMMOGRAM  Never done    There are no preventive care reminders to display for this patient.  Lab Results  Component Value Date   TSH 0.688 10/20/2020   Lab Results  Component Value Date   WBC 10.8 10/20/2020   HGB 14.7 10/20/2020   HCT 43.2 10/20/2020   MCV 89 10/20/2020   PLT 425 10/20/2020   Lab Results  Component Value Date   NA 136 10/20/2020   K 4.2 10/20/2020   CO2 28 10/20/2020   GLUCOSE 73 10/20/2020   BUN 10 10/20/2020   CREATININE 0.98 10/20/2020   BILITOT 0.3 10/20/2020    ALKPHOS 90 10/20/2020   AST 14 10/20/2020   ALT 24 10/20/2020   PROT 6.9 10/20/2020   ALBUMIN 4.3 10/20/2020   CALCIUM 9.7 10/20/2020   Lab Results  Component Value Date   CHOL 174 10/20/2020   Lab Results  Component Value Date   HDL 48 10/20/2020   Lab Results  Component Value Date   LDLCALC 113 (H) 10/20/2020   Lab Results  Component Value Date   TRIG 68 10/20/2020   Lab Results  Component Value Date   CHOLHDL 3.6 10/20/2020   Lab Results  Component Value Date   HGBA1C 6.4 (H) 10/20/2020      Assessment & Plan:   Problem List Items Addressed This Visit       Cardiovascular and Mediastinum   Essential hypertension - Primary   Relevant Medications   atenolol-chlorthalidone (TENORETIC) 50-25 MG tablet   Blood Pressure Monitoring (BLOOD PRESSURE CUFF) MISC   Other Relevant Orders   CBC with Differential/Platelet   Other Visit Diagnoses     Screening for endocrine, metabolic and immunity disorder       Relevant Orders   Lipid panel   Lipid screening       Relevant Orders   Comprehensive metabolic panel   Hemoglobin A1c   TSH   Screening mammogram for breast cancer       Relevant Orders   MM Digital Screening       Meds ordered this encounter  Medications   atenolol-chlorthalidone (TENORETIC) 50-25 MG tablet    Sig: Take 1 tablet by mouth daily.    Dispense:  90 tablet    Refill:  1    Order Specific Question:   Supervising Provider    Answer:   Carlota Raspberry, JEFFREY R [[id number]]   Blood Pressure Monitoring (BLOOD PRESSURE CUFF) MISC    Sig: 1 each by Does not apply route once for 1 dose.    Dispense:  1 each    Refill:  0    Order Specific Question:   Supervising Provider    Answer:   Carlota Raspberry, JEFFREY R [id number]    Follow-up: Return in about 6 months (around 07/10/2022) for CPE and labs.   PLAN Change therapy to atenolol-chlorthalidone. Recheck in 6 mo Mammo ordered Labs collected. Will follow up with the patient as warranted. Patient encouraged  to call clinic  with any questions, comments, or concerns.  Maximiano Coss, NP

## 2022-01-10 NOTE — Patient Instructions (Signed)
Ms. Murton -  Randie Heinz to see you!  Change medication as noted on this sheet.  See you in 6 mo for a physical and labs.  Mammogram order sent  Call if you need anything  Thank you  Rich

## 2022-01-30 ENCOUNTER — Encounter: Payer: Self-pay | Admitting: Registered Nurse

## 2022-01-31 NOTE — Telephone Encounter (Signed)
Patient has some concerns about Metformin because she heard it can affect your kidneys. Pt would like to know if she should be concerned and if she needs a different medication.

## 2022-02-22 ENCOUNTER — Other Ambulatory Visit: Payer: Self-pay | Admitting: Registered Nurse

## 2022-02-22 DIAGNOSIS — I10 Essential (primary) hypertension: Secondary | ICD-10-CM

## 2022-03-04 ENCOUNTER — Ambulatory Visit: Payer: BC Managed Care – PPO

## 2022-03-04 ENCOUNTER — Ambulatory Visit
Admission: RE | Admit: 2022-03-04 | Discharge: 2022-03-04 | Disposition: A | Discharge status: Home / Self Care | Payer: BC Managed Care – PPO | Source: Ambulatory Visit | Attending: Registered Nurse | Admitting: Registered Nurse

## 2022-03-04 ENCOUNTER — Other Ambulatory Visit: Payer: Self-pay

## 2022-03-04 DIAGNOSIS — Z1231 Encounter for screening mammogram for malignant neoplasm of breast: Secondary | ICD-10-CM

## 2022-03-08 ENCOUNTER — Other Ambulatory Visit: Payer: Self-pay | Admitting: Registered Nurse

## 2022-03-08 DIAGNOSIS — R928 Other abnormal and inconclusive findings on diagnostic imaging of breast: Secondary | ICD-10-CM

## 2022-03-10 ENCOUNTER — Ambulatory Visit
Admission: RE | Admit: 2022-03-10 | Discharge: 2022-03-10 | Disposition: A | Discharge status: Home / Self Care | Payer: BC Managed Care – PPO | Source: Ambulatory Visit | Attending: Registered Nurse | Admitting: Registered Nurse

## 2022-03-10 ENCOUNTER — Other Ambulatory Visit: Payer: Self-pay

## 2022-03-10 ENCOUNTER — Other Ambulatory Visit: Payer: Self-pay | Admitting: Registered Nurse

## 2022-03-16 ENCOUNTER — Ambulatory Visit
Admission: RE | Admit: 2022-03-16 | Discharge: 2022-03-16 | Disposition: A | Discharge status: Home / Self Care | Payer: BC Managed Care – PPO | Source: Ambulatory Visit | Attending: Registered Nurse | Admitting: Registered Nurse

## 2022-03-16 ENCOUNTER — Other Ambulatory Visit: Payer: Self-pay | Admitting: Radiology

## 2022-03-16 DIAGNOSIS — R928 Other abnormal and inconclusive findings on diagnostic imaging of breast: Secondary | ICD-10-CM

## 2022-03-30 ENCOUNTER — Encounter: Payer: Self-pay | Admitting: Registered Nurse

## 2022-03-31 NOTE — Telephone Encounter (Signed)
Patient states she would like to have an alternative for Metformin because she read the medication can cause kidney problems.  ?

## 2022-04-05 ENCOUNTER — Other Ambulatory Visit: Payer: Self-pay | Admitting: Registered Nurse

## 2022-04-05 DIAGNOSIS — E119 Type 2 diabetes mellitus without complications: Secondary | ICD-10-CM

## 2022-04-05 MED ORDER — EMPAGLIFLOZIN 25 MG PO TABS: 25.0000 mg | ORAL_TABLET | Freq: Every day | ORAL | 1 refills | Status: DC

## 2022-04-11 NOTE — Telephone Encounter (Signed)
Patient returned phone call. °

## 2022-04-11 NOTE — Telephone Encounter (Signed)
Attempted to call patient. LVM to return call. 

## 2022-04-12 NOTE — Telephone Encounter (Signed)
I spoke to the patient she understood and was going to get a Goodrx card to see if it would make the Price a little cheaper for the medication. ?

## 2022-04-16 ENCOUNTER — Other Ambulatory Visit: Payer: Self-pay | Admitting: Registered Nurse

## 2022-04-16 DIAGNOSIS — E119 Type 2 diabetes mellitus without complications: Secondary | ICD-10-CM | POA: Insufficient documentation

## 2022-04-16 DIAGNOSIS — E1169 Type 2 diabetes mellitus with other specified complication: Secondary | ICD-10-CM | POA: Insufficient documentation

## 2022-04-16 MED ORDER — DAPAGLIFLOZIN PROPANEDIOL 10 MG PO TABS: 10.0000 mg | ORAL_TABLET | Freq: Every day | ORAL | 1 refills | Status: DC

## 2022-07-01 ENCOUNTER — Other Ambulatory Visit: Payer: Self-pay | Admitting: Registered Nurse

## 2022-07-01 DIAGNOSIS — I10 Essential (primary) hypertension: Secondary | ICD-10-CM

## 2022-07-05 ENCOUNTER — Telehealth: Payer: Self-pay | Admitting: Registered Nurse

## 2022-07-05 ENCOUNTER — Other Ambulatory Visit: Payer: Self-pay

## 2022-07-05 DIAGNOSIS — E119 Type 2 diabetes mellitus without complications: Secondary | ICD-10-CM

## 2022-07-05 MED ORDER — EMPAGLIFLOZIN 25 MG PO TABS: 25.0000 mg | ORAL_TABLET | Freq: Every day | ORAL | 1 refills | Status: DC

## 2022-07-05 MED ORDER — ROSUVASTATIN CALCIUM 5 MG PO TABS: 5.0000 mg | ORAL_TABLET | Freq: Every day | ORAL | 3 refills | Status: DC

## 2022-07-05 NOTE — Telephone Encounter (Signed)
Sent!

## 2022-07-05 NOTE — Telephone Encounter (Signed)
Encourage patient to contact the pharmacy for refills or they can request refills through [facility]  (Please schedule appointment if patient has not been seen in over a year)    WHAT PHARMACY WOULD THEY LIKE THIS SENT TO: [facility] [phone]  MEDICATION NAME & DOSE: jardiance 25 mg and rosuvastatin 5 mg  NOTES/COMMENTS FROM PATIENT:pt statesthat she will be going out of town on Friday      Front office please notify patient: It takes 48-72 hours to process rx refill requests Ask patient to call pharmacy to ensure rx is ready before heading there.

## 2022-07-07 ENCOUNTER — Encounter: Payer: BC Managed Care – PPO | Admitting: Registered Nurse

## 2022-07-14 NOTE — Telephone Encounter (Signed)
error 

## 2022-07-19 ENCOUNTER — Telehealth: Payer: Self-pay | Admitting: Registered Nurse

## 2022-07-19 NOTE — Telephone Encounter (Signed)
TOC requested to Dr [place]. Provider is leaving practice

## 2022-07-20 ENCOUNTER — Encounter: Payer: BC Managed Care – PPO | Admitting: Registered Nurse

## 2022-07-20 NOTE — Telephone Encounter (Signed)
Okay to schedule, thank you! 

## 2022-07-20 NOTE — Telephone Encounter (Signed)
It is ok to schedule. °Thanks, °BJ °

## 2022-07-24 ENCOUNTER — Other Ambulatory Visit: Payer: Self-pay | Admitting: Registered Nurse

## 2022-07-24 DIAGNOSIS — I10 Essential (primary) hypertension: Secondary | ICD-10-CM

## 2022-08-01 ENCOUNTER — Ambulatory Visit: Payer: BC Managed Care – PPO | Admitting: Family Medicine

## 2022-08-01 ENCOUNTER — Encounter: Payer: Self-pay | Admitting: Family Medicine

## 2022-08-01 VITALS — BP 128/80 | HR 89 | Resp 12 | Ht 65.0 in | Wt 242.4 lb

## 2022-08-01 DIAGNOSIS — E1169 Type 2 diabetes mellitus with other specified complication: Secondary | ICD-10-CM

## 2022-08-01 DIAGNOSIS — I10 Essential (primary) hypertension: Secondary | ICD-10-CM | POA: Diagnosis not present

## 2022-08-01 DIAGNOSIS — Z1159 Encounter for screening for other viral diseases: Secondary | ICD-10-CM

## 2022-08-01 DIAGNOSIS — E785 Hyperlipidemia, unspecified: Secondary | ICD-10-CM | POA: Diagnosis not present

## 2022-08-01 LAB — POCT GLYCOSYLATED HEMOGLOBIN (HGB A1C): Hemoglobin A1C: 7.2 % — AB (ref 4.0–5.6)

## 2022-08-01 MED ORDER — ATENOLOL-CHLORTHALIDONE 50-25 MG PO TABS: 1.0000 | ORAL_TABLET | Freq: Every day | ORAL | 1 refills | Status: DC

## 2022-08-01 MED ORDER — EMPAGLIFLOZIN 25 MG PO TABS: 25.0000 mg | ORAL_TABLET | Freq: Every day | ORAL | 1 refills | Status: DC

## 2022-08-01 NOTE — Assessment & Plan Note (Signed)
LDL 124 in 12/2022. She is not fasting today, so will plan on FLP next visit. Continue Rosuvastatin 5 mg daily.

## 2022-08-01 NOTE — Progress Notes (Unsigned)
HPI: [name] is a 51 y.o. female, who is here today to establish care.  Former PCP: Kristine Agee, NP Last preventive routine visit: 10/10/20  Chronic medical problems: DM type 2, hypertension, and HLD among some.  DM II Dx'ed in 12/2021 with a HgA1C 7.2. She is on Jardiance 25 mg daily, which she has skipped sometimes because causes urinary frequency. Diabetes Mellitus II:  - eye exam: Current. Dx'ed with macular degeneration, has af/u appt 08/03/22. - Negative for symptoms of hypoglycemia, polyuria, polydipsia, numbness extremities, foot ulcers/trauma  Hyperlipidemia: Currently on Rosuvastatin 5 mg, started in 12/2021. Side effects from medication:None. Lab Results  Component Value Date   CHOL 197 01/10/2022   HDL 45.80 01/10/2022   LDLCALC 124 (H) 01/10/2022   TRIG 133.0 01/10/2022   CHOLHDL 4 01/10/2022   Hypertension:  Medications:Atenolol-Chlorthalidone 50-25 mg daily. BP readings at home:Not checking. Side effects:None. Negative for unusual or severe headache, visual changes, exertional chest pain, dyspnea,  focal weakness, or edema.  Lab Results  Component Value Date   CREATININE 0.95 01/10/2022   BUN 14 01/10/2022   NA 136 01/10/2022   K 4.0 01/10/2022   CL 96 01/10/2022   CO2 30 01/10/2022   She has tried to follow a healthful diet. Just came back from vacation, so she was not as consistent as she had been. She walks the treadmill; about 3 times per week.  Review of Systems  Constitutional:  Negative for activity change, appetite change and fever.  HENT:  Negative for mouth sores, nosebleeds and sore throat.   Respiratory:  Negative for cough and wheezing.   Gastrointestinal:  Negative for abdominal pain, nausea and vomiting.       Negative for changes in bowel habits.  Endocrine: Negative for cold intolerance and heat intolerance.  Genitourinary:  Negative for decreased urine volume and hematuria.  Skin:  Negative for rash.  Neurological:   Negative for syncope, facial asymmetry and weakness.  Psychiatric/Behavioral:  Negative for confusion. The patient is not nervous/anxious.   Rest see pertinent positives and negatives per HPI.  Current Outpatient Medications on File Prior to Visit  Medication Sig Dispense Refill   rosuvastatin (CRESTOR) 5 MG tablet Take 1 tablet (5 mg total) by mouth daily. 90 tablet 3   No current facility-administered medications on file prior to visit.    Past Medical History:  Diagnosis Date   Hypertension    Allergies  Allergen Reactions   Iohexol      Code: HIVES, Desc: hives/rash/leg & arm pain a few hours after iv contrast administration- pt admitted- benadryl & solu-medrol given- 13-hr pre-meds required for future iv contrast administration, Onset Date: [id number]     Family History  Problem Relation Age of Onset   Hypertension Mother    Arthritis Father    Breast cancer Neg Hx    Social History   Socioeconomic History   Marital status: Single    Spouse name: Not on file   Number of children: 3   Years of education: Not on file   Highest education level: Not on file  Occupational History   Not on file  Tobacco Use   Smoking status: Never   Smokeless tobacco: Never  Vaping Use   Vaping Use: Never used  Substance and Sexual Activity   Alcohol use: No   Drug use: No   Sexual activity: Never  Other Topics Concern   Not on file  Social History Narrative   Marital status: separated  in 2019 after 14 years of marriage.  Not dating in 2021.      Children: 3 children (26, 23, 21); no grandchildren.      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      Tobacco: none      Alcohol: none      Drugs: none      Exercise:  Some; sporadic in 2019.  Treadmill at gym three days per week.           Social Determinants of Health   Financial Resource Strain: Not on file  Food Insecurity: Not on file   Transportation Needs: Not on file  Physical Activity: Not on file  Stress: Not on file  Social Connections: Not on file   Vitals:   08/01/22 1514  BP: 128/80  Pulse: 89  Resp: 12  SpO2: 95%   Body mass index is 40.33 kg/m.  Physical Exam Vitals and nursing note reviewed.  Constitutional:      General: She is not in acute distress.    Appearance: She is well-developed.  HENT:     Head: Normocephalic and atraumatic.     Mouth/Throat:     Mouth: Mucous membranes are moist.     Pharynx: Oropharynx is clear.  Eyes:     Conjunctiva/sclera: Conjunctivae normal.  Cardiovascular:     Rate and Rhythm: Normal rate and regular rhythm.     Pulses:          Dorsalis pedis pulses are 2+ on the right side and 2+ on the left side.     Heart sounds: No murmur heard. Pulmonary:     Effort: Pulmonary effort is normal. No respiratory distress.     Breath sounds: Normal breath sounds.  Abdominal:     Palpations: Abdomen is soft. There is no hepatomegaly or mass.     Tenderness: There is no abdominal tenderness.  Lymphadenopathy:     Cervical: No cervical adenopathy.  Skin:    General: Skin is warm.     Findings: No erythema or rash.  Neurological:     General: No focal deficit present.     Mental Status: She is alert and oriented to person, place, and time.     Cranial Nerves: No cranial nerve deficit.     Gait: Gait normal.  Psychiatric:     Comments: Well groomed, good eye contact.    Diabetic Foot Exam - Simple   Simple Foot Form Diabetic Foot exam was performed with the following findings: Yes 08/01/2022  4:24 PM  Visual Inspection No deformities, no ulcerations, no other skin breakdown bilaterally: Yes Sensation Testing Intact to touch and monofilament testing bilaterally: Yes Pulse Check Posterior Tibialis and Dorsalis pulse intact bilaterally: Yes Comments    ASSESSMENT AND PLAN:  [name] was seen today for establish care. Orders Placed This Encounter   Procedures   Basic metabolic panel   ALT   Microalbumin / creatinine urine ratio   Hepatitis C antibody   Amb Referral to Nutrition and Diabetic Education   POC HgB A1c   Lab Results  Component Value Date   MICROALBUR <0.7 08/01/2022    Lab Results  Component Value Date   CREATININE 1.07 08/01/2022   BUN 17 08/01/2022   NA 140 08/01/2022   K 3.6 08/01/2022   CL 95 (L) 08/01/2022   CO2 36 (H) 08/01/2022  Lab Results  Component Value Date   ALT 47 (H) 08/01/2022   Type 2 diabetes mellitus with other specified complication (HCC) HgA1C is not at goal, stable. She does not want to try Metformin. Continue Jardiance 25 mg daily. Appt with nutritionist will be arranged. Annual eye exam, periodic dental and foot care recommended. F/U in 4 months   Hyperlipidemia associated with type 2 diabetes mellitus (HCC) LDL 124 in 12/2022. She is not fasting today, so will plan on FLP next visit. Continue Rosuvastatin 5 mg daily.  Essential hypertension BP adequately controlled. Continue current management: Atenolol-Chlorthalidone 50-25 mg daily. DASH/low salt diet to continue. Monitor BP at home. Eye exam is current.  Encounter for HCV screening test for low risk patient -     Hepatitis C antibody  Return in about 18 weeks (around 12/05/2022).  Azekiel Cremer G. [place], MD  [facility]. [place] office.

## 2022-08-01 NOTE — Assessment & Plan Note (Addendum)
HgA1C is not at goal, stable. She does not want to try Metformin. Continue Jardiance 25 mg daily. Appt with nutritionist will be arranged. Annual eye exam, periodic dental and foot care recommended. F/U in 4 months

## 2022-08-01 NOTE — Patient Instructions (Addendum)
A few things to remember from today's visit:   Type 2 diabetes mellitus without complication, without long-term current use of insulin (HCC) - Plan: POC HgB A1c  Essential hypertension  Encounter for HCV screening test for low risk patient  Type 2 diabetes mellitus with other specified complication, without long-term current use of insulin (HCC) - Plan: Basic metabolic panel, ALT, Microalbumin / creatinine urine ratio, Amb Referral to Nutrition and Diabetic Education  If you need refills please call your pharmacy. Do not use My Chart to request refills or for acute issues that need immediate attention.   Please be sure medication list is accurate. If a new problem present, please set up appointment sooner than planned today. Diabetes Mellitus and Nutrition, Adult When you have diabetes, or diabetes mellitus, it is very important to have healthy eating habits because your blood sugar (glucose) levels are greatly affected by what you eat and drink. Eating healthy foods in the right amounts, at about the same times every day, can help you: Manage your blood glucose. Lower your risk of heart disease. Improve your blood pressure. Reach or maintain a healthy weight. What can affect my meal plan? Every person with diabetes is different, and each person has different needs for a meal plan. Your health care provider may recommend that you work with a dietitian to make a meal plan that is best for you. Your meal plan may vary depending on factors such as: The calories you need. The medicines you take. Your weight. Your blood glucose, blood pressure, and cholesterol levels. Your activity level. Other health conditions you have, such as heart or kidney disease. How do carbohydrates affect me? Carbohydrates, also called carbs, affect your blood glucose level more than any other type of food. Eating carbs raises the amount of glucose in your blood. It is important to know how many carbs you can  safely have in each meal. This is different for every person. Your dietitian can help you calculate how many carbs you should have at each meal and for each snack. How does alcohol affect me? Alcohol can cause a decrease in blood glucose (hypoglycemia), especially if you use insulin or take certain diabetes medicines by mouth. Hypoglycemia can be a life-threatening condition. Symptoms of hypoglycemia, such as sleepiness, dizziness, and confusion, are similar to symptoms of having too much alcohol. Do not drink alcohol if: Your health care provider tells you not to drink. You are pregnant, may be pregnant, or are planning to become pregnant. If you drink alcohol: Limit how much you have to: 0-1 drink a day for women. 0-2 drinks a day for men. Know how much alcohol is in your drink. In the [place] one drink equals one 12 oz bottle of beer (355 mL), one 5 oz glass of wine (148 mL), or one 1 oz glass of hard liquor (44 mL). Keep yourself hydrated with water, diet soda, or unsweetened iced tea. Keep in mind that regular soda, juice, and other mixers may contain a lot of sugar and must be counted as carbs. What are tips for following this plan?  Reading food labels Start by checking the serving size on the Nutrition Facts label of packaged foods and drinks. The number of calories and the amount of carbs, fats, and other nutrients listed on the label are based on one serving of the item. Many items contain more than one serving per package. Check the total grams (g) of carbs in one serving. Check the number of grams  of saturated fats and trans fats in one serving. Choose foods that have a low amount or none of these fats. Check the number of milligrams (mg) of salt (sodium) in one serving. Most people should limit total sodium intake to less than 2,300 mg per day. Always check the nutrition information of foods labeled as "low-fat" or "nonfat." These foods may be higher in added sugar or refined carbs and  should be avoided. Talk to your dietitian to identify your daily goals for nutrients listed on the label. Shopping Avoid buying canned, pre-made, or processed foods. These foods tend to be high in fat, sodium, and added sugar. Shop around the outside edge of the grocery store. This is where you will most often find fresh fruits and vegetables, bulk grains, fresh meats, and fresh dairy products. Cooking Use low-heat cooking methods, such as baking, instead of high-heat cooking methods, such as deep frying. Cook using healthy oils, such as olive, canola, or sunflower oil. Avoid cooking with butter, cream, or high-fat meats. Meal planning Eat meals and snacks regularly, preferably at the same times every day. Avoid going long periods of time without eating. Eat foods that are high in fiber, such as fresh fruits, vegetables, beans, and whole grains. Eat 4-6 oz (112-168 g) of lean protein each day, such as lean meat, chicken, fish, eggs, or tofu. One ounce (oz) (28 g) of lean protein is equal to: 1 oz (28 g) of meat, chicken, or fish. 1 egg.  cup (62 g) of tofu. Eat some foods each day that contain healthy fats, such as avocado, nuts, seeds, and fish. What foods should I eat? Fruits Berries. Apples. Oranges. Peaches. Apricots. Plums. Grapes. Mangoes. Papayas. Pomegranates. Kiwi. Cherries. Vegetables Leafy greens, including lettuce, spinach, kale, chard, collard greens, mustard greens, and cabbage. Beets. Cauliflower. Broccoli. Carrots. Green beans. Tomatoes. Peppers. Onions. Cucumbers. Brussels sprouts. Grains Whole grains, such as whole-wheat or whole-grain bread, crackers, tortillas, cereal, and pasta. Unsweetened oatmeal. Quinoa. Brown or wild rice. Meats and other proteins Seafood. Poultry without skin. Lean cuts of poultry and beef. Tofu. Nuts. Seeds. Dairy Low-fat or fat-free dairy products such as milk, yogurt, and cheese. The items listed above may not be a complete list of foods and  beverages you can eat and drink. Contact a dietitian for more information. What foods should I avoid? Fruits Fruits canned with syrup. Vegetables Canned vegetables. Frozen vegetables with butter or cream sauce. Grains Refined white flour and flour products such as bread, pasta, snack foods, and cereals. Avoid all processed foods. Meats and other proteins Fatty cuts of meat. Poultry with skin. Breaded or fried meats. Processed meat. Avoid saturated fats. Dairy Full-fat yogurt, cheese, or milk. Beverages Sweetened drinks, such as soda or iced tea. The items listed above may not be a complete list of foods and beverages you should avoid. Contact a dietitian for more information. Questions to ask a health care provider Do I need to meet with a certified diabetes care and education specialist? Do I need to meet with a dietitian? What number can I call if I have questions? When are the best times to check my blood glucose? Where to find more information: American Diabetes Association: diabetes.org Academy of Nutrition and Dietetics: eatright.[employer] of Diabetes and Digestive and Kidney Diseases: StageSync.si Association of Diabetes Care & Education Specialists: diabeteseducator.org Summary It is important to have healthy eating habits because your blood sugar (glucose) levels are greatly affected by what you eat and drink. It is important  to use alcohol carefully. A healthy meal plan will help you manage your blood glucose and lower your risk of heart disease. Your health care provider may recommend that you work with a dietitian to make a meal plan that is best for you. This information is not intended to replace advice given to you by your health care provider. Make sure you discuss any questions you have with your health care provider. Document Revised: 07/15/2020 Document Reviewed: 07/15/2020 Elsevier Patient Education  2023 [employer].

## 2022-08-01 NOTE — Assessment & Plan Note (Signed)
BP adequately controlled. Continue current management: Atenolol-Chlorthalidone 50-25 mg daily. DASH/low salt diet to continue. Monitor BP at home. Eye exam is current.

## 2022-08-02 LAB — HEPATITIS C ANTIBODY: Hepatitis C Ab: NONREACTIVE

## 2022-08-02 LAB — MICROALBUMIN / CREATININE URINE RATIO
Creatinine,U: 43.3 mg/dL
Microalb Creat Ratio: 1.6 mg/g (ref 0.0–30.0)
Microalb, Ur: <0.7 mg/dL (ref 0.0–1.9)

## 2022-08-02 LAB — BASIC METABOLIC PANEL
BUN: 17 mg/dL (ref 6–23)
CO2: 36 mEq/L — ABNORMAL HIGH (ref 19–32)
Calcium: 10.3 mg/dL (ref 8.4–10.5)
Chloride: 95 mEq/L — ABNORMAL LOW (ref 96–112)
Creatinine, Ser: 1.07 mg/dL (ref 0.40–1.20)
GFR: 59.57 mL/min — ABNORMAL LOW (ref >60.00)
Glucose, Bld: 159 mg/dL — ABNORMAL HIGH (ref 70–99)
Potassium: 3.6 mEq/L (ref 3.5–5.1)
Sodium: 140 mEq/L (ref 135–145)

## 2022-08-02 LAB — ALT: ALT: 47 U/L — ABNORMAL HIGH (ref 0–35)

## 2022-08-04 ENCOUNTER — Encounter: Payer: Self-pay | Admitting: Family Medicine

## 2022-08-04 ENCOUNTER — Encounter: Payer: BC Managed Care – PPO | Attending: Family Medicine | Admitting: Registered"

## 2022-08-04 ENCOUNTER — Encounter: Payer: Self-pay | Admitting: Registered"

## 2022-08-04 DIAGNOSIS — E1169 Type 2 diabetes mellitus with other specified complication: Secondary | ICD-10-CM | POA: Insufficient documentation

## 2022-08-04 NOTE — Patient Instructions (Signed)
Instructions/Goals:   Goal #1: Plan 3 daily meals using carb counting and balanced plate image:  Include 2-3 carbohydrate servings per meal/30-45 g carbohydrates per meal Balance half plate non-starchy vegetables, 1/4 protein, 1/4 carbohydrate (2-3 carbohydrate servings)  Water Goal: Include at least 80 oz water daily.   Goal #2: Consider checking blood sugar 2 times daily, fasting in morning and 1 time after meal 1-2 hours.  Fasting goal: 80-130  After meal goal: less than 180  Ask doctor about writing prescription for meter and strips and check with insurance about what type of meter is best covered.   Goal #3: Walk 15 minutes x 5 days per week.

## 2022-08-04 NOTE — Progress Notes (Signed)
Diabetes Self-Management Education  Visit Type: First/Initial  Appt. Start Time: 0800 Appt. End Time: 0904  08/04/2022  Ms. Kristine Mcdonald, identified by name and date of birth, is a 51 y.o. female with a diagnosis of Diabetes: Type 2.   ASSESSMENT  Last menstrual period 07/21/2019. There is no height or weight on file to calculate BMI.  Pt reports her main question is what she should eat of more or less. Reports she is not checking blood sugar but feels comfortable with SMBG due to helping family check in the past. Pt does not have a meter.   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 Pt reports she usually eats a snack for breakfast due to school starting at 7 AM or may stop at [employer] for biscuit, orange juice, hashbrowns and then may skip lunch and do snack instead. Reports if not eating breakfast may do Nutrigrain bar with medication then do lunch and dinner. Typically pt eats 2 meals and 1 snack. Usual beverages include 5 bottles water daily, sometimes lemonade or tea but mostly water.   Pt reports walking on treadmill 3 days x 15 minutes each time which has been off and on and started back this week. Reports with traveling some this summer has been off her workout routine. Pt reports feeling good about working on goal of 15 minutes 5 days per week and over time working to increase minutes per walk.    Diabetes Self-Management Education - 08/04/22 0805       Visit Information   Visit Type First/Initial      Initial Visit   Diabetes Type Type 2    Date Diagnosed 12/2021    Are you currently following a meal plan? No    Are you taking your medications as prescribed? Yes      Health Coping   How would you rate your overall health? Good      Psychosocial Assessment   Patient Belief/Attitude about Diabetes Motivated to manage diabetes    What is the hardest part about your diabetes right now, causing you the most concern, or is the most worrisome to you about your  diabetes?   Making healty food and beverage choices    Self-management support Family    Other persons present Patient    Patient Concerns Nutrition/Meal planning    Special Needs None    Preferred Learning Style No preference indicated    Learning Readiness Ready    How often do you need to have someone help you when you read instructions, pamphlets, or other written materials from your doctor or pharmacy? 1 - Never    What is the last grade level you completed in school? Masters      Pre-Education Assessment   Patient understands the diabetes disease and treatment process. Needs Instruction    Patient understands incorporating nutritional management into lifestyle. Needs Instruction    Patient undertands incorporating physical activity into lifestyle. Needs Instruction    Patient understands using medications safely. Demonstrates understanding / competency    Patient understands monitoring blood glucose, interpreting and using results Needs Instruction    Patient understands prevention, detection, and treatment of acute complications. Needs Instruction    Patient understands prevention, detection, and treatment of chronic complications. Needs Instruction    Patient understands how to develop strategies to address psychosocial issues. Demonstrates understanding / competency    Patient understands how to develop strategies to promote health/change behavior. Demonstrates understanding / competency  Complications   Last HgB A1C per patient/outside source 7.2 %    How often do you check your blood sugar? 0 times/day (not testing)    Number of hypoglycemic episodes per month 0    Number of hyperglycemic episodes ( >200mg /dL): --   Pt is not SMBG   Have you had a dilated eye exam in the past 12 months? Yes    Have you had a dental exam in the past 12 months? No    Are you checking your feet? Yes    How many days per week are you checking your feet? 7      Dietary Intake   Breakfast  brown maple oatmeal instant, water    Lunch cucumber and tomato salad with zesty [place] dressing, 1 serving cookies    Snack (afternoon) bbq potato chips, 3 cherry plums    Dinner stewed potatoes, grilled bbq pork chops    Beverage(s) ~80 oz water      Activity / Exercise   Activity / Exercise Type Light (walking / raking leaves);ADL's    How many days per week do you exercise? 3    How many minutes per day do you exercise? 15    Total minutes per week of exercise 45      Patient Education   Previous Diabetes Education No    Disease Pathophysiology Definition of diabetes, type 1 and 2, and the diagnosis of diabetes;Factors that contribute to the development of diabetes    Healthy Eating Role of diet in the treatment of diabetes and the relationship between the three main macronutrients and blood glucose level;Food label reading, portion sizes and measuring food.;Plate Method;Carbohydrate counting    Being Active Role of exercise on diabetes management, blood pressure control and cardiac health.    Medications Reviewed patients medication for diabetes, action, purpose, timing of dose and side effects.    Monitoring Purpose and frequency of SMBG.;Taught/discussed recording of test results and interpretation of SMBG.;Identified appropriate SMBG and/or A1C goals.;Daily foot exams;Yearly dilated eye exam    Acute complications Taught prevention, symptoms, and  treatment of hypoglycemia - the 15 rule.;Discussed and identified patients' prevention, symptoms, and treatment of hyperglycemia.    Chronic complications Relationship between chronic complications and blood glucose control;Assessed and discussed foot care and prevention of foot problems;Lipid levels, blood glucose control and heart disease;Dental care;Identified and discussed with patient  current chronic complications;Retinopathy and reason for yearly dilated eye exams;Nephropathy, what it is, prevention of, the use of ACE, ARB's and early  detection of through urine microalbumia.    Diabetes Stress and Support Identified and addressed patients feelings and concerns about diabetes;Role of stress on diabetes      Individualized Goals (developed by patient)   Nutrition Follow meal plan discussed;Carb counting;General guidelines for healthy choices and portions discussed    Physical Activity Exercise 3-5 times per week;15 minutes per day    Medications take my medication as prescribed    Monitoring  Test my blood glucose as discussed    Problem Solving Eating Pattern      Post-Education Assessment   Patient understands the diabetes disease and treatment process. Demonstrates understanding / competency    Patient understands incorporating nutritional management into lifestyle. Demonstrates understanding / competency    Patient undertands incorporating physical activity into lifestyle. Demonstrates understanding / competency    Patient understands using medications safely. Demonstrates understanding / competency    Patient understands monitoring blood glucose, interpreting and using results Demonstrates understanding / competency  Patient understands prevention, detection, and treatment of acute complications. Demonstrates understanding / competency    Patient understands prevention, detection, and treatment of chronic complications. Demonstrates understanding / competency    Patient understands how to develop strategies to address psychosocial issues. Demonstrates understanding / competency    Patient understands how to develop strategies to promote health/change behavior. Demonstrates understanding / competency      Outcomes   Expected Outcomes Demonstrated interest in learning. Expect positive outcomes    Future DMSE PRN    Program Status Completed             Individualized Plan for Diabetes Self-Management Training:   Learning Objective:  Patient will have a greater understanding of diabetes self-management. Patient  education plan is to attend individual and/or group sessions per assessed needs and concerns.   Plan:  Instructions/Goals:   Goal #1: Plan 3 daily meals using carb counting and balanced plate image:  Include 2-3 carbohydrate servings per meal/30-45 g carbohydrates per meal Balance half plate non-starchy vegetables, 1/4 protein, 1/4 carbohydrate (2-3 carbohydrate servings)  Water Goal: Include at least 80 oz water daily.   Goal #2: Consider checking blood sugar 2 times daily, fasting in morning and 1 time after meal 1-2 hours.  Fasting goal: 80-130  After meal goal: less than 180  Ask doctor about writing prescription for meter and strips and check with insurance about what type of meter is best covered.   Goal #3: Walk 15 minutes x 5 days per week.   Patient Instructions  Instructions/Goals:   Goal #1: Plan 3 daily meals using carb counting and balanced plate image:  Include 2-3 carbohydrate servings per meal/30-45 g carbohydrates per meal Balance half plate non-starchy vegetables, 1/4 protein, 1/4 carbohydrate (2-3 carbohydrate servings)  Water Goal: Include at least 80 oz water daily.   Goal #2: Consider checking blood sugar 2 times daily, fasting in morning and 1 time after meal 1-2 hours.  Fasting goal: 80-130  After meal goal: less than 180  Ask doctor about writing prescription for meter and strips and check with insurance about what type of meter is best covered.   Goal #3: Walk 15 minutes x 5 days per week.     Expected Outcomes:  Demonstrated interest in learning. Expect positive outcomes  Education material provided: ADA - How to Thrive: A Guide for Your Journey with Diabetes, Meal plan card, My Plate, and Support group flyer  If problems or questions, patient to contact team via:  Phone, Email.   Future DSME appointment: PRN

## 2022-08-05 MED ORDER — ONETOUCH ULTRASOFT LANCETS MISC: 12 refills | Status: AC

## 2022-08-05 MED ORDER — ONETOUCH ULTRA 2 W/DEVICE KIT: PACK | 0 refills | Status: AC

## 2022-08-05 MED ORDER — ONETOUCH ULTRA VI STRP: ORAL_STRIP | 12 refills | Status: AC

## 2022-12-02 ENCOUNTER — Ambulatory Visit: Payer: BC Managed Care – PPO | Admitting: Family Medicine

## 2022-12-02 NOTE — Progress Notes (Unsigned)
HPI: [name] is a 51 y.o. female, who is here today for chronic disease management.  Last seen on 08/01/22 DM II Dx'ed in 12/2021 with a HgA1C 7.2. She is on Jardiance 25 mg daily. Last visit HgA1C was 7.2 in 07/2022. She met with nutritionist. She reports having changed her diet, and started walking on her treadmill almost daily, she has not done it son for the past couple weeks due to work schedule but planning on resuming it.  Negative for symptoms of hypoglycemia, polyuria, polydipsia, numbness extremities, foot ulcers/trauma.  Hypertension: She is currently on atenolol-chlorthalidone 50-25 mg daily. She is not checking BP regularly. Negative for unusual or severe headache, visual changes, exertional chest pain, dyspnea,  focal weakness, or edema. Lab Results  Component Value Date   CREATININE 1.07 08/01/2022   BUN 17 08/01/2022   NA 140 08/01/2022   K 3.6 08/01/2022   CL 95 (L) 08/01/2022   CO2 36 (H) 08/01/2022   Hyperlipidemia: Currently she is on rosuvastatin 5 mg daily, which she started last visit and has tolerated it well.  Lab Results  Component Value Date   CHOL 197 01/10/2022   HDL 45.80 01/10/2022   LDLCALC 124 (H) 01/10/2022   TRIG 133.0 01/10/2022   CHOLHDL 4 01/10/2022   Review of Systems  Constitutional:  Negative for chills and fever.  Respiratory:  Negative for cough and wheezing.   Gastrointestinal:  Negative for abdominal pain, nausea and vomiting.  Genitourinary:  Negative for decreased urine volume, dysuria and hematuria.  Musculoskeletal:  Negative for gait problem and myalgias.  Skin:  Negative for rash.  Neurological:  Negative for syncope and facial asymmetry.  See other pertinent positives and negatives in HPI.  Current Outpatient Medications on File Prior to Visit  Medication Sig Dispense Refill   atenolol-chlorthalidone (TENORETIC) 50-25 MG tablet Take 1 tablet by mouth daily. 90 tablet 1   Blood Glucose Monitoring Suppl (ONE  TOUCH ULTRA 2) w/Device KIT Use to test blood sugars 1-2 times daily. 1 kit 0   empagliflozin (JARDIANCE) 25 MG TABS tablet Take 1 tablet (25 mg total) by mouth daily. 90 tablet 1   glucose blood (ONETOUCH ULTRA) test strip Use to test blood sugars 1-2 times daily. 100 each 12   Lancets (ONETOUCH ULTRASOFT) lancets Use to test blood sugars 1-2 times daily. 100 each 12   rosuvastatin (CRESTOR) 5 MG tablet Take 1 tablet (5 mg total) by mouth daily. 90 tablet 3   No current facility-administered medications on file prior to visit.   Past Medical History:  Diagnosis Date   Diabetes mellitus without complication (HCC)    Hyperlipidemia    Hypertension    Allergies  Allergen Reactions   Iohexol      Code: HIVES, Desc: hives/rash/leg & arm pain a few hours after iv contrast administration- pt admitted- benadryl & solu-medrol given- 13-hr pre-meds required for future iv contrast administration, Onset Date: [id number]    Social History   Socioeconomic History   Marital status: Single    Spouse name: Not on file   Number of children: 3   Years of education: Not on file   Highest education level: Master's degree (e.g., MA, MS, MEng, MEd, MSW, MBA)  Occupational History   Not on file  Tobacco Use   Smoking status: Never   Smokeless tobacco: Never  Vaping Use   Vaping Use: Never used  Substance and Sexual Activity   Alcohol use: No   Drug use:  No   Sexual activity: Never  Other Topics Concern   Not on file  Social History Narrative   Marital status: separated in 2019 after 14 years of marriage.  Not dating in 2021.      Children: 3 children (26, 23, 21); no grandchildren.      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      Tobacco: none      Alcohol: none      Drugs: none      Exercise:  Some; sporadic in 2019.  Treadmill at gym three days per week.           Social Determinants of Health    Financial Resource Strain: Low Risk  (12/04/2022)   Overall Financial Resource Strain (CARDIA)    Difficulty of Paying Living Expenses: Not hard at all  Food Insecurity: No Food Insecurity (12/04/2022)   Hunger Vital Sign    Worried About Running Out of Food in the Last Year: Never true    Ran Out of Food in the Last Year: Never true  Transportation Needs: No Transportation Needs (12/04/2022)   PRAPARE - Hydrologist (Medical): No    Lack of Transportation (Non-Medical): No  Physical Activity: Insufficiently Active (12/04/2022)   Exercise Vital Sign    Days of Exercise per Week: 3 days    Minutes of Exercise per Session: 20 min  Stress: No Stress Concern Present (12/04/2022)   [place]    Feeling of Stress : Not at all  Social Connections: Moderately Integrated (12/04/2022)   Social Connection and Isolation Panel [NHANES]    Frequency of Communication with Friends and Family: More than three times a week    Frequency of Social Gatherings with Friends and Family: Not on file    Attends Religious Services: More than 4 times per year    Active Member of Clubs or Organizations: Yes    Attends Archivist Meetings: 1 to 4 times per year    Marital Status: Separated   Vitals:   12/05/22 0719  BP: 120/72  Pulse: 89  Resp: 16  Temp: 98.9 F (37.2 C)  SpO2: 98%   Body mass index is 39.02 kg/m.  Physical Exam Vitals and nursing note reviewed.  Constitutional:      General: She is not in acute distress.    Appearance: She is well-developed.  HENT:     Head: Normocephalic and atraumatic.     Mouth/Throat:     Mouth: Mucous membranes are moist.     Pharynx: Oropharynx is clear.  Eyes:     Conjunctiva/sclera: Conjunctivae normal.  Cardiovascular:     Rate and Rhythm: Normal rate and regular rhythm.     Heart sounds: No murmur heard.    Comments: DP pulses  present. Pulmonary:     Effort: Pulmonary effort is normal. No respiratory distress.     Breath sounds: Normal breath sounds.  Abdominal:     Palpations: Abdomen is soft. There is no hepatomegaly or mass.     Tenderness: There is no abdominal tenderness.  Musculoskeletal:     Right lower leg: No edema.     Left lower leg: No edema.  Lymphadenopathy:     Cervical: No cervical adenopathy.  Skin:    General: Skin is warm.  Findings: No erythema or rash.  Neurological:     General: No focal deficit present.     Mental Status: She is alert and oriented to person, place, and time.     Cranial Nerves: No cranial nerve deficit.     Gait: Gait normal.  Psychiatric:        Mood and Affect: Mood and affect normal.   ASSESSMENT AND PLAN:  Ms.Seth was seen today for follow-up.  Diagnoses and all orders for this visit: Orders Placed This Encounter  Procedures   Flu Vaccine QUAD 60moIM (Fluarix, Fluzone & Alfiuria Quad PF)   Basic metabolic panel   Lipid panel   POC HgB A1c   Lab Results  Component Value Date   HGBA1C 6.8 (A) 12/05/2022   Lab Results  Component Value Date   CREATININE 1.07 08/01/2022   BUN 17 08/01/2022   NA 140 08/01/2022   K 3.6 08/01/2022   CL 95 (L) 08/01/2022   CO2 36 (H) 08/01/2022   Lab Results  Component Value Date   CHOL 197 01/10/2022   HDL 45.80 01/10/2022   LDLCALC 124 (H) 01/10/2022   TRIG 133.0 01/10/2022   CHOLHDL 4 01/10/2022   Type 2 diabetes mellitus with other specified complication, without long-term current use of insulin (HCC) Assessment & Plan: HgA1C has improved, now at goal, 6.8 (7.2) Continue Jardiance 25 mg daily. Annual eye exam have to date.  Continue periodic dental and foot care. Declined Pneumovax. F/U in 6 months.   Orders: -     POCT glycosylated hemoglobin (Hb A1C)  Essential hypertension Assessment & Plan: BP adequately controlled. Continue Atenolol-Chlorthalidone 50-25 mg daily. DASH/low salt diet to  continue. Monitor BP at home. Eye exam is current.  Orders: -     Basic metabolic panel; Future  Hyperlipidemia associated with type 2 diabetes mellitus (HC[place] Assessment & Plan: Last LDL was not at goal, 124 in 12/2021. Continue rosuvastatin 5 mg daily and low-fat diet. Further recommendation will be given according to lipid panel result.  Orders: -     Lipid panel; Future  Need for influenza vaccination -     Flu Vaccine QUAD 678moM (Fluarix, Fluzone & Alfiuria Quad PF)  Morbid obesity with BMI of 40.0-44.9, adult ([facility] Center[facility]essment & Plan: She has lost about 8 Lb since her last visit. Congratulated and encouraged to continue regular physical activity and following a healthful diet.   Return in about 6 months (around 06/06/2023) for chronic problems.  Dorothe Elmore G. JoMartiniqueMD  [facility].

## 2022-12-05 ENCOUNTER — Ambulatory Visit: Payer: BC Managed Care – PPO | Admitting: Family Medicine

## 2022-12-05 ENCOUNTER — Encounter: Payer: Self-pay | Admitting: Family Medicine

## 2022-12-05 VITALS — BP 120/72 | HR 89 | Temp 98.9°F | Resp 16 | Ht 65.0 in | Wt 234.5 lb

## 2022-12-05 DIAGNOSIS — Z6841 Body Mass Index (BMI) 40.0 and over, adult: Secondary | ICD-10-CM

## 2022-12-05 DIAGNOSIS — E876 Hypokalemia: Secondary | ICD-10-CM | POA: Diagnosis not present

## 2022-12-05 DIAGNOSIS — Z23 Encounter for immunization: Secondary | ICD-10-CM

## 2022-12-05 DIAGNOSIS — E785 Hyperlipidemia, unspecified: Secondary | ICD-10-CM

## 2022-12-05 DIAGNOSIS — E1169 Type 2 diabetes mellitus with other specified complication: Secondary | ICD-10-CM | POA: Diagnosis not present

## 2022-12-05 DIAGNOSIS — I10 Essential (primary) hypertension: Secondary | ICD-10-CM

## 2022-12-05 LAB — BASIC METABOLIC PANEL
BUN: 20 mg/dL (ref 6–23)
CO2: 33 mEq/L — ABNORMAL HIGH (ref 19–32)
Calcium: 9.7 mg/dL (ref 8.4–10.5)
Chloride: 95 mEq/L — ABNORMAL LOW (ref 96–112)
Creatinine, Ser: 1.02 mg/dL (ref 0.40–1.20)
GFR: 62.94 mL/min (ref >60.00)
Glucose, Bld: 107 mg/dL — ABNORMAL HIGH (ref 70–99)
Potassium: 2.9 mEq/L — ABNORMAL LOW (ref 3.5–5.1)
Sodium: 138 mEq/L (ref 135–145)

## 2022-12-05 LAB — LIPID PANEL
Cholesterol: 124 mg/dL (ref 0–200)
HDL: 36.6 mg/dL — ABNORMAL LOW (ref >39.00)
LDL Cholesterol: 62 mg/dL (ref 0–99)
NonHDL: 87.69
Total CHOL/HDL Ratio: 3
Triglycerides: 130 mg/dL (ref 0.0–149.0)
VLDL: 26 mg/dL (ref 0.0–40.0)

## 2022-12-05 LAB — POCT GLYCOSYLATED HEMOGLOBIN (HGB A1C): Hemoglobin A1C: 6.8 % — AB (ref 4.0–5.6)

## 2022-12-05 NOTE — Assessment & Plan Note (Signed)
BP adequately controlled. Continue Atenolol-Chlorthalidone 50-25 mg daily. DASH/low salt diet to continue. Monitor BP at home. Eye exam is current.

## 2022-12-05 NOTE — Assessment & Plan Note (Addendum)
HgA1C has improved, now at goal, 6.8 (7.2) Continue Jardiance 25 mg daily. Annual eye exam have to date.  Continue periodic dental and foot care. Declined Pneumovax. F/U in 6 months.

## 2022-12-05 NOTE — Patient Instructions (Signed)
A few things to remember from today's visit:  Type 2 diabetes mellitus with other specified complication, without long-term current use of insulin (HCC) - Plan: POC HgB A1c  Essential hypertension - Plan: Basic metabolic panel  Hyperlipidemia associated with type 2 diabetes mellitus (HCC) - Plan: Lipid panel  Numbers are great today. No changes. Continue the good work!!!  If you need refills for medications you take chronically, please call your pharmacy. Do not use My Chart to request refills or for acute issues that need immediate attention. If you send a my chart message, it may take a few days to be addressed, specially if I am not in the office.  Please be sure medication list is accurate. If a new problem present, please set up appointment sooner than planned today.

## 2022-12-05 NOTE — Assessment & Plan Note (Signed)
Last LDL was not at goal, 124 in 12/2021. Continue rosuvastatin 5 mg daily and low-fat diet. Further recommendation will be given according to lipid panel result.

## 2022-12-05 NOTE — Assessment & Plan Note (Signed)
She has lost about 8 Lb since her last visit. Congratulated and encouraged to continue regular physical activity and following a healthful diet.

## 2022-12-07 NOTE — Addendum Note (Signed)
Addended by: [place], Soraya Paquette G on: 12/07/2022 01:41 PM   Modules accepted: Orders

## 2022-12-16 ENCOUNTER — Other Ambulatory Visit (INDEPENDENT_AMBULATORY_CARE_PROVIDER_SITE_OTHER): Payer: BC Managed Care – PPO

## 2022-12-16 DIAGNOSIS — E876 Hypokalemia: Secondary | ICD-10-CM | POA: Diagnosis not present

## 2022-12-16 LAB — POTASSIUM: Potassium: 3.4 mEq/L — ABNORMAL LOW (ref 3.5–5.1)

## 2022-12-21 ENCOUNTER — Other Ambulatory Visit: Payer: Self-pay | Admitting: Family Medicine

## 2022-12-21 DIAGNOSIS — E876 Hypokalemia: Secondary | ICD-10-CM

## 2022-12-21 DIAGNOSIS — I10 Essential (primary) hypertension: Secondary | ICD-10-CM

## 2022-12-21 MED ORDER — ATENOLOL 25 MG PO TABS: 25.0000 mg | ORAL_TABLET | Freq: Every day | ORAL | 3 refills | Status: DC

## 2022-12-21 MED ORDER — AMLODIPINE BESYLATE 5 MG PO TABS: 5.0000 mg | ORAL_TABLET | Freq: Every day | ORAL | 0 refills | Status: DC

## 2022-12-24 ENCOUNTER — Encounter: Payer: Self-pay | Admitting: Family Medicine

## 2023-01-22 ENCOUNTER — Encounter: Payer: Self-pay | Admitting: Family Medicine

## 2023-01-23 NOTE — Progress Notes (Unsigned)
HPI:  [name] is a 52 y.o. female, who is here today to follow on recent visit.  Review of Systems See other pertinent positives and negatives in HPI.  Current Outpatient Medications on File Prior to Visit  Medication Sig Dispense Refill   amLODipine (NORVASC) 5 MG tablet Take 1 tablet (5 mg total) by mouth daily. 90 tablet 0   atenolol (TENORMIN) 25 MG tablet Take 1 tablet (25 mg total) by mouth daily. 90 tablet 3   Blood Glucose Monitoring Suppl (ONE TOUCH ULTRA 2) w/Device KIT Use to test blood sugars 1-2 times daily. 1 kit 0   empagliflozin (JARDIANCE) 25 MG TABS tablet Take 1 tablet (25 mg total) by mouth daily. 90 tablet 1   glucose blood (ONETOUCH ULTRA) test strip Use to test blood sugars 1-2 times daily. 100 each 12   Lancets (ONETOUCH ULTRASOFT) lancets Use to test blood sugars 1-2 times daily. 100 each 12   rosuvastatin (CRESTOR) 5 MG tablet Take 1 tablet (5 mg total) by mouth daily. 90 tablet 3   No current facility-administered medications on file prior to visit.    Past Medical History:  Diagnosis Date   Diabetes mellitus without complication (HCC)    Hyperlipidemia    Hypertension    Allergies  Allergen Reactions   Iohexol      Code: HIVES, Desc: hives/rash/leg & arm pain a few hours after iv contrast administration- pt admitted- benadryl & solu-medrol given- 13-hr pre-meds required for future iv contrast administration, Onset Date: [id number]     Social History   Socioeconomic History   Marital status: Single    Spouse name: Not on file   Number of children: 3   Years of education: Not on file   Highest education level: Master's degree (e.g., MA, MS, MEng, MEd, MSW, MBA)  Occupational History   Not on file  Tobacco Use   Smoking status: Never   Smokeless tobacco: Never  Vaping Use   Vaping Use: Never used  Substance and Sexual Activity   Alcohol use: No   Drug use: No   Sexual activity: Never  Other Topics Concern   Not on file   Social History Narrative   Marital status: separated in 2019 after 14 years of marriage.  Not dating in 2021.      Children: 3 children (26, 23, 21); no grandchildren.      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      Tobacco: none      Alcohol: none      Drugs: none      Exercise:  Some; sporadic in 2019.  Treadmill at gym three days per week.           Social Determinants of Health   Financial Resource Strain: Low Risk  (12/04/2022)   Overall Financial Resource Strain (CARDIA)    Difficulty of Paying Living Expenses: Not hard at all  Food Insecurity: No Food Insecurity (12/04/2022)   Hunger Vital Sign    Worried About Running Out of Food in the Last Year: Never true    Ran Out of Food in the Last Year: Never true  Transportation Needs: No Transportation Needs (12/04/2022)   PRAPARE - Hydrologist (Medical): No    Lack of Transportation (Non-Medical): No  Physical Activity: Insufficiently Active (12/04/2022)   Exercise Vital Sign  Days of Exercise per Week: 3 days    Minutes of Exercise per Session: 20 min  Stress: No Stress Concern Present (12/04/2022)   [place]    Feeling of Stress : Not at all  Social Connections: Moderately Integrated (12/04/2022)   Social Connection and Isolation Panel [NHANES]    Frequency of Communication with Friends and Family: More than three times a week    Frequency of Social Gatherings with Friends and Family: Not on file    Attends Religious Services: More than 4 times per year    Active Member of [employer] or Organizations: Yes    Attends Archivist Meetings: 1 to 4 times per year    Marital Status: Separated    There were no vitals filed for this visit. There is no height or weight on file to calculate BMI.  Physical Exam  ASSESSMENT AND  PLAN:  There are no diagnoses linked to this encounter.  No orders of the defined types were placed in this encounter.   No problem-specific Assessment & Plan notes found for this encounter.   No follow-ups on file.  Samanyu Tinnell G. [place], MD  [facility]. [place] office.

## 2023-01-24 ENCOUNTER — Ambulatory Visit: Payer: BC Managed Care – PPO | Admitting: Family Medicine

## 2023-01-24 ENCOUNTER — Encounter: Payer: Self-pay | Admitting: Family Medicine

## 2023-01-24 VITALS — BP 118/78 | HR 85 | Temp 98.2°F | Resp 12 | Ht 65.0 in | Wt 246.1 lb

## 2023-01-24 DIAGNOSIS — I10 Essential (primary) hypertension: Secondary | ICD-10-CM | POA: Diagnosis not present

## 2023-01-24 DIAGNOSIS — R600 Localized edema: Secondary | ICD-10-CM | POA: Diagnosis not present

## 2023-01-24 DIAGNOSIS — E876 Hypokalemia: Secondary | ICD-10-CM

## 2023-01-24 DIAGNOSIS — Z6841 Body Mass Index (BMI) 40.0 and over, adult: Secondary | ICD-10-CM

## 2023-01-24 DIAGNOSIS — E1169 Type 2 diabetes mellitus with other specified complication: Secondary | ICD-10-CM

## 2023-01-24 LAB — BASIC METABOLIC PANEL
BUN: 17 mg/dL (ref 6–23)
CO2: 30 mEq/L (ref 19–32)
Calcium: 9.7 mg/dL (ref 8.4–10.5)
Chloride: 101 mEq/L (ref 96–112)
Creatinine, Ser: 0.88 mg/dL (ref 0.40–1.20)
GFR: 75.07 mL/min (ref >60.00)
Glucose, Bld: 93 mg/dL (ref 70–99)
Potassium: 4 mEq/L (ref 3.5–5.1)
Sodium: 137 mEq/L (ref 135–145)

## 2023-01-24 NOTE — Assessment & Plan Note (Signed)
Gained about 10 to 11 pounds since her last visit. Consistency with healthy diet and physical activity encouraged. She understands importance of maintaining a healthy weight, planning on resuming physical activity.

## 2023-01-24 NOTE — Assessment & Plan Note (Signed)
11/2022 K+ 3.4. Chlorthalidone has been discontinued. Further recommendations according to BMP result.

## 2023-01-24 NOTE — Assessment & Plan Note (Signed)
We discussed possible etiologies. History suggest vein disease. Lower extremity elevation and compression stockings will help. If problem gets worse, we could consider adding furosemide 20 mg daily as needed, we discussed side effects.

## 2023-01-24 NOTE — Assessment & Plan Note (Signed)
BP is adequately controlled. Continue amlodipine 5 mg daily and atenolol 25 mg daily. We discussed some side effects of medications. Recommend monitoring BP at home. Eye exam is current. Follow-up in 5 months, before if needed.

## 2023-01-24 NOTE — Patient Instructions (Signed)
A few things to remember from today's visit:  Essential hypertension  Hypokalemia  Bilateral lower extremity edema No changes today. Compression stocking will help with leg swelling. Monitor blood pressure. Resume treadmill.  If you need refills for medications you take chronically, please call your pharmacy. Do not use My Chart to request refills or for acute issues that need immediate attention. If you send a my chart message, it may take a few days to be addressed, specially if I am not in the office.  Please be sure medication list is accurate. If a new problem present, please set up appointment sooner than planned today.

## 2023-03-20 ENCOUNTER — Other Ambulatory Visit: Payer: Self-pay | Admitting: Family Medicine

## 2023-03-20 DIAGNOSIS — I10 Essential (primary) hypertension: Secondary | ICD-10-CM

## 2023-04-27 ENCOUNTER — Encounter: Payer: Self-pay | Admitting: Family Medicine

## 2023-04-27 DIAGNOSIS — E1169 Type 2 diabetes mellitus with other specified complication: Secondary | ICD-10-CM

## 2023-04-28 MED ORDER — EMPAGLIFLOZIN 25 MG PO TABS: 25.0000 mg | ORAL_TABLET | Freq: Every day | ORAL | 1 refills | Status: DC

## 2023-06-26 ENCOUNTER — Other Ambulatory Visit: Payer: Self-pay | Admitting: Family Medicine

## 2023-06-26 DIAGNOSIS — Z1231 Encounter for screening mammogram for malignant neoplasm of breast: Secondary | ICD-10-CM

## 2023-06-28 ENCOUNTER — Ambulatory Visit
Admission: RE | Admit: 2023-06-28 | Discharge: 2023-06-28 | Disposition: A | Discharge status: Home / Self Care | Payer: BC Managed Care – PPO | Source: Ambulatory Visit | Attending: Family Medicine | Admitting: Family Medicine

## 2023-06-28 DIAGNOSIS — Z1231 Encounter for screening mammogram for malignant neoplasm of breast: Secondary | ICD-10-CM

## 2023-07-27 ENCOUNTER — Encounter: Payer: Self-pay | Admitting: Family Medicine

## 2023-07-27 DIAGNOSIS — E119 Type 2 diabetes mellitus without complications: Secondary | ICD-10-CM

## 2023-07-28 MED ORDER — ROSUVASTATIN CALCIUM 5 MG PO TABS: 5.0000 mg | ORAL_TABLET | Freq: Every day | ORAL | 3 refills | Status: DC

## 2023-08-02 LAB — HM DIABETES EYE EXAM

## 2023-09-15 ENCOUNTER — Other Ambulatory Visit: Payer: Self-pay | Admitting: Family Medicine

## 2023-09-15 DIAGNOSIS — I10 Essential (primary) hypertension: Secondary | ICD-10-CM

## 2023-10-28 ENCOUNTER — Other Ambulatory Visit: Payer: Self-pay | Admitting: Family Medicine

## 2023-10-28 DIAGNOSIS — E1169 Type 2 diabetes mellitus with other specified complication: Secondary | ICD-10-CM

## 2023-12-17 ENCOUNTER — Other Ambulatory Visit: Payer: Self-pay | Admitting: Family Medicine

## 2023-12-17 DIAGNOSIS — I10 Essential (primary) hypertension: Secondary | ICD-10-CM

## 2024-01-22 ENCOUNTER — Other Ambulatory Visit: Payer: Self-pay | Admitting: Family Medicine

## 2024-01-22 DIAGNOSIS — I10 Essential (primary) hypertension: Secondary | ICD-10-CM

## 2024-02-02 ENCOUNTER — Other Ambulatory Visit: Payer: Self-pay | Admitting: Family Medicine

## 2024-02-02 DIAGNOSIS — E1169 Type 2 diabetes mellitus with other specified complication: Secondary | ICD-10-CM

## 2024-03-01 ENCOUNTER — Other Ambulatory Visit: Payer: Self-pay | Admitting: Family Medicine

## 2024-03-01 DIAGNOSIS — E1169 Type 2 diabetes mellitus with other specified complication: Secondary | ICD-10-CM

## 2024-03-05 ENCOUNTER — Other Ambulatory Visit: Payer: Self-pay | Admitting: Family Medicine

## 2024-03-05 DIAGNOSIS — E1169 Type 2 diabetes mellitus with other specified complication: Secondary | ICD-10-CM

## 2024-03-06 MED ORDER — EMPAGLIFLOZIN 25 MG PO TABS: 25.0000 mg | ORAL_TABLET | Freq: Every day | ORAL | 0 refills | Status: DC

## 2024-03-06 NOTE — Progress Notes (Incomplete)
 HPI: Kristine Mcdonald is a 53 y.o. female with a PMHx significant for DM II, HLD, and HTN, who is here today for chronic disease management.  Last seen on 01/24/2023  *** Review of Systems See other pertinent positives and negatives in HPI.  Current Outpatient Medications on File Prior to Visit  Medication Sig Dispense Refill   amLODipine (NORVASC) 5 MG tablet Take 1 tablet (5 mg total) by mouth daily. Due for follow up/physical 90 tablet 0   atenolol (TENORMIN) 25 MG tablet TAKE 1 TABLET (25 MG TOTAL) BY MOUTH DAILY. 90 tablet 1   Blood Glucose Monitoring Suppl (ONE TOUCH ULTRA 2) w/Device KIT Use to test blood sugars 1-2 times daily. 1 kit 0   empagliflozin (JARDIANCE) 25 MG TABS tablet Take 1 tablet (25 mg total) by mouth daily. Due for follow up 30 tablet 0   glucose blood (ONETOUCH ULTRA) test strip Use to test blood sugars 1-2 times daily. 100 each 12   Lancets (ONETOUCH ULTRASOFT) lancets Use to test blood sugars 1-2 times daily. 100 each 12   rosuvastatin (CRESTOR) 5 MG tablet Take 1 tablet (5 mg total) by mouth daily. 90 tablet 3   No current facility-administered medications on file prior to visit.    Past Medical History:  Diagnosis Date   Diabetes mellitus without complication (HCC)    Hyperlipidemia    Hypertension    Allergies  Allergen Reactions   Iohexol      Code: HIVES, Desc: hives/rash/leg & arm pain a few hours after iv contrast administration- pt admitted- benadryl & solu-medrol given- 13-hr pre-meds required for future iv contrast administration, Onset Date: [id number]     Social History   Socioeconomic History   Marital status: Single    Spouse name: Not on file   Number of children: 3   Years of education: Not on file   Highest education level: Master's degree (e.g., MA, MS, MEng, MEd, MSW, MBA)  Occupational History   Not on file  Tobacco Use   Smoking status: Never   Smokeless tobacco: Never  Vaping Use   Vaping status: Never Used   Substance and Sexual Activity   Alcohol use: No   Drug use: No   Sexual activity: Never  Other Topics Concern   Not on file  Social History Narrative   Marital status: separated in 2019 after 14 years of marriage.  Not dating in 2021.      Children: 3 children (26, 23, 21); no grandchildren.      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      Tobacco: none      Alcohol: none      Drugs: none      Exercise:  Some; sporadic in 2019.  Treadmill at gym three days per week.           Social Drivers of Corporate investment banker Strain: Low Risk  (12/04/2022)   Overall Financial Resource Strain (CARDIA)    Difficulty of Paying Living Expenses: Not hard at all  Food Insecurity: No Food Insecurity (12/04/2022)   Hunger Vital Sign    Worried About Running Out of Food in the Last Year: Never true    Ran Out of Food in the Last Year: Never true  Transportation Needs: No Transportation Needs (12/04/2022)   PRAPARE - Transportation  Lack of Transportation (Medical): No    Lack of Transportation (Non-Medical): No  Physical Activity: Insufficiently Active (12/04/2022)   Exercise Vital Sign    Days of Exercise per Week: 3 days    Minutes of Exercise per Session: 20 min  Stress: No Stress Concern Present (12/04/2022)   [name] of Occupational Health - Occupational Stress Questionnaire    Feeling of Stress : Not at all  Social Connections: Moderately Integrated (12/04/2022)   Social Connection and Isolation Panel [NHANES]    Frequency of Communication with Friends and Family: More than three times a week    Frequency of Social Gatherings with Friends and Family: Not on file    Attends Religious Services: More than 4 times per year    Active Member of [employer] or Organizations: Yes    Attends Banker Meetings: 1 to 4 times per year    Marital Status: Separated    There were  no vitals filed for this visit. There is no height or weight on file to calculate BMI.  Physical Exam  ASSESSMENT AND PLAN:  Kristine Mcdonald was seen today for chronic disease management.   No orders of the defined types were placed in this encounter.   No problem-specific Assessment & Plan notes found for this encounter.   No follow-ups on file.  I, Rolla Etienne Wierda, acting as a scribe for Betty [place], MD., have documented all relevant documentation on the behalf of Betty [place], MD, as directed by  Betty [place], MD while in the presence of Betty [place], MD.   I, Betty [place], MD, have reviewed all documentation for this visit. The documentation on 03/06/24 for the exam, diagnosis, procedures, and orders are all accurate and complete.  Betty G. [place], MD  [facility]. [place] office.

## 2024-03-08 ENCOUNTER — Ambulatory Visit: Admitting: Family Medicine

## 2024-03-13 NOTE — Progress Notes (Signed)
 HPI: Kristine Mcdonald is a 53 y.o. female with a PMHx significant for DM II, HLD, and HTN, who is here today for chronic disease management.  Last seen on 01/24/2023  Hypertension:  Medications: Currently on amlodipine 5 mg daily and atenolol 25 mg daily.  he has been checking her BP at home and says her readings have been 120s/70s.  Side effects: none Negative for unusual or severe headache, visual changes, exertional chest pain, dyspnea,  focal weakness, or edema.  Lab Results  Component Value Date   CREATININE 0.88 01/24/2023   BUN 17 01/24/2023   NA 137 01/24/2023   K 4.0 01/24/2023   CL 101 01/24/2023   CO2 30 01/24/2023   Hyperlipidemia: Currently on rosuvastatin 5 mg daily.  Side effects from medication: none Lab Results  Component Value Date   CHOL 124 12/05/2022   HDL 36.60 (L) 12/05/2022   LDLCALC 62 12/05/2022   TRIG 130.0 12/05/2022   CHOLHDL 3 12/05/2022   Diabetes Mellitus II: Dx'ed in 12/2021 with a HgA1C 7.2.  - Checking BG at home: She has not been checking her blood sugars regularly at home.  - Medications: Currently on Jardiance 25 mg daily.  - Diet: She has decreased her intake of sweets for Kristine Mcdonald.  - Exercise: She has been walking frequently.  - eye exam: 01/2023. She is planning to schedule an appointment soon.  - foot exam: performed today - Negative for symptoms of hypoglycemia, polyuria, polydipsia, numbness, tingling, or burning of extremities, foot ulcers/trauma  Lab Results  Component Value Date   HGBA1C 6.8 (A) 12/05/2022   Lab Results  Component Value Date   MICROALBUR <0.7 08/01/2022   Concerns today:  Patient mentions she has had some soreness in her right heel for about two weeks. She says it is worse in the mornings when she first gets up or walking after sitting for some time. Pain is mild. No hx of trauma. No edema or erythema.  Review of Systems  Constitutional:  Negative for activity change, appetite change, chills and  fever.  HENT:  Negative for mouth sores and sore throat.   Respiratory:  Negative for cough and wheezing.   Gastrointestinal:  Negative for abdominal pain, nausea and vomiting.  Genitourinary:  Negative for decreased urine volume, dysuria and hematuria.  Skin:  Negative for rash.  Neurological:  Negative for syncope and facial asymmetry.  Psychiatric/Behavioral:  Negative for confusion. The patient is not nervous/anxious.   See other pertinent positives and negatives in HPI.  Current Outpatient Medications on File Prior to Visit  Medication Sig Dispense Refill   Blood Glucose Monitoring Suppl (ONE TOUCH ULTRA 2) w/Device KIT Use to test blood sugars 1-2 times daily. 1 kit 0   glucose blood (ONETOUCH ULTRA) test strip Use to test blood sugars 1-2 times daily. 100 each 12   Lancets (ONETOUCH ULTRASOFT) lancets Use to test blood sugars 1-2 times daily. 100 each 12   rosuvastatin (CRESTOR) 5 MG tablet Take 1 tablet (5 mg total) by mouth daily. 90 tablet 3   No current facility-administered medications on file prior to visit.   Past Medical History:  Diagnosis Date   Diabetes mellitus without complication (HCC)    Hyperlipidemia    Hypertension    Allergies  Allergen Reactions   Iohexol      Code: HIVES, Desc: hives/rash/leg & arm pain a few hours after iv contrast administration- pt admitted- benadryl & solu-medrol given- 13-hr pre-meds required for future iv  contrast administration, Onset Date: [id number]    Social History   Socioeconomic History   Marital status: Single    Spouse name: Not on file   Number of children: 3   Years of education: Not on file   Highest education level: Master's degree (e.g., MA, MS, MEng, MEd, MSW, MBA)  Occupational History   Not on file  Tobacco Use   Smoking status: Never   Smokeless tobacco: Never  Vaping Use   Vaping status: Never Used  Substance and Sexual Activity   Alcohol use: No   Drug use: No   Sexual activity: Never  Other Topics  Concern   Not on file  Social History Narrative   Marital status: separated in 2019 after 14 years of marriage.  Not dating in 2021.      Children: 3 children (26, 23, 21); no grandchildren.      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      Tobacco: none      Alcohol: none      Drugs: none      Exercise:  Some; sporadic in 2019.  Treadmill at gym three days per week.           Social Drivers of Corporate investment banker Strain: Low Risk  (03/15/2024)   Overall Financial Resource Strain (CARDIA)    Difficulty of Paying Living Expenses: Not very hard  Food Insecurity: No Food Insecurity (03/15/2024)   Hunger Vital Sign    Worried About Running Out of Food in the Last Year: Never true    Ran Out of Food in the Last Year: Never true  Transportation Needs: No Transportation Needs (03/15/2024)   PRAPARE - Administrator, Civil Service (Medical): No    Lack of Transportation (Non-Medical): No  Physical Activity: Insufficiently Active (03/15/2024)   Exercise Vital Sign    Days of Exercise per Week: 3 days    Minutes of Exercise per Session: 10 min  Stress: No Stress Concern Present (03/15/2024)   [name] of Occupational Health - Occupational Stress Questionnaire    Feeling of Stress : Not at all  Social Connections: Moderately Integrated (03/15/2024)   Social Connection and Isolation Panel [NHANES]    Frequency of Communication with Friends and Family: More than three times a week    Frequency of Social Gatherings with Friends and Family: Three times a week    Attends Religious Services: More than 4 times per year    Active Member of Clubs or Organizations: Yes    Attends Banker Meetings: 1 to 4 times per year    Marital Status: Separated   Vitals:   03/15/24 0727  BP: 126/80  Pulse: 91  Resp: 16  SpO2: 99%   Wt Readings from Last 3 Encounters:  03/15/24  264 lb 4 oz (119.9 kg)  01/24/23 246 lb 2 oz (111.6 kg)  12/05/22 234 lb 8 oz (106.4 kg)   Body mass index is 43.97 kg/m.  Physical Exam Vitals and nursing note reviewed.  Constitutional:      General: She is not in acute distress.    Appearance: She is well-developed.  HENT:     Head: Normocephalic and atraumatic.     Mouth/Throat:     Mouth: Mucous membranes are moist.     Pharynx: Oropharynx is clear. Uvula midline.  Eyes:  Conjunctiva/sclera: Conjunctivae normal.  Cardiovascular:     Rate and Rhythm: Normal rate and regular rhythm.     Pulses:          Dorsalis pedis pulses are 2+ on the right side and 2+ on the left side.     Heart sounds: No murmur heard. Pulmonary:     Effort: Pulmonary effort is normal. No respiratory distress.     Breath sounds: Normal breath sounds.  Abdominal:     Palpations: Abdomen is soft. There is no hepatomegaly or mass.     Tenderness: There is no abdominal tenderness.  Musculoskeletal:     Right foot: Normal range of motion and normal capillary refill. Bunion present. No swelling or tenderness. Normal pulse.     Comments: Right foot:No tenderness upon palpation of heel or at the insertion of plantar fascia into calcaneous. Dorsal flexion of first MTP does not elicits pain.  Lymphadenopathy:     Cervical: No cervical adenopathy.  Skin:    General: Skin is warm.     Findings: No erythema or rash.  Neurological:     General: No focal deficit present.     Mental Status: She is alert and oriented to person, place, and time.     Cranial Nerves: No cranial nerve deficit.     Gait: Gait normal.  Psychiatric:        Mood and Affect: Mood and affect normal.   ASSESSMENT AND PLAN:  Ms. Dimaggio was seen today for chronic disease management.   Orders Placed This Encounter  Procedures   Pneumococcal conjugate vaccine 20-valent (Prevnar 20)   Comprehensive metabolic panel   Lipid panel   Microalbumin / creatinine urine ratio   Ambulatory  referral to Gastroenterology   POC HgB A1c   Lab Results  Component Value Date   HGBA1C 6.5 03/15/2024   Lab Results  Component Value Date   NA 139 03/15/2024   CL 100 03/15/2024   K 4.3 03/15/2024   CO2 32 03/15/2024   BUN 15 03/15/2024   CREATININE 0.94 03/15/2024   GFR 68.81 03/15/2024   CALCIUM 9.9 03/15/2024   ALBUMIN 4.5 03/15/2024   GLUCOSE 107 (H) 03/15/2024   Lab Results  Component Value Date   ALT 45 (H) 03/15/2024   AST 28 03/15/2024   ALKPHOS 100 03/15/2024   BILITOT 0.8 03/15/2024   Lab Results  Component Value Date   CHOL 122 03/15/2024   HDL 41.50 03/15/2024   LDLCALC 61 03/15/2024   TRIG 100.0 03/15/2024   CHOLHDL 3 03/15/2024   Lab Results  Component Value Date   MICROALBUR 6.2 (H) 03/15/2024   MICROALBUR <0.7 08/01/2022   Type 2 diabetes mellitus with other specified complication, without long-term current use of insulin (HCC) Assessment & Plan: Problem is well-controlled. Continue Jardiance 25 mg daily. Continue appropriate foot and dental care and periodic eye exam. F/U in 6 months.  Orders: -     Comprehensive metabolic panel; Future -     Microalbumin / creatinine urine ratio; Future -     POCT glycosylated hemoglobin (Hb A1C) -     Empagliflozin; Take 1 tablet (25 mg total) by mouth daily. Due for follow up  Dispense: 90 tablet; Refill: 1 -     Pneumococcal conjugate vaccine 20-valent  Hyperlipidemia associated with type 2 diabetes mellitus (HCC) Assessment & Plan: Continue rosuvastatin 5 mg daily and low-fat diet. Last LDL 62 in 11/2022. Further recommendation will be given according to lipid panel result.  Orders: -     Comprehensive metabolic panel; Future -     Lipid panel; Future  Plantar fasciitis Mild. Educated diagnosis, prognosis, treatment options. Recommend stretching exercises, comfortable shoes, and shoe inserts. If problem does not resolve or gets worse, she may need to see a podiatrist to discuss further  treatment options.  Essential hypertension Assessment & Plan: Overall BP here today adequately controlled, reporting lower readings at home. Continue amlodipine 5 mg daily and atenolol 25 mg daily as well as low-salt diet. Continue monitoring BP at home. Eye exam is current. Follow-up in 6 months.  Orders: -     Comprehensive metabolic panel; Future -     amLODIPine Besylate; Take 1 tablet (5 mg total) by mouth daily. Due for follow up/physical  Dispense: 90 tablet; Refill: 3 -     Atenolol; Take 1 tablet (25 mg total) by mouth daily.  Dispense: 90 tablet; Refill: 1  Colon cancer screening -     Ambulatory referral to Gastroenterology  Need for pneumococcal vaccination -     Pneumococcal conjugate vaccine 20-valent  Return in about 6 months (around 09/15/2024) for chronic problems.  I, Rolla Etienne Wierda, acting as a scribe for Stasha Naraine [place], MD., have documented all relevant documentation on the behalf of Jennavieve Arrick [place], MD, as directed by  Kennedy Brines [place], MD while in the presence of Jalisha Enneking [place], MD.   I, Briggitte Boline [place], MD, have reviewed all documentation for this visit. The documentation on 03/15/24 for the exam, diagnosis, procedures, and orders are all accurate and complete.  Ramie Palladino G. [place], MD  [facility]. [place] office.

## 2024-03-15 ENCOUNTER — Encounter: Payer: Self-pay | Admitting: Family Medicine

## 2024-03-15 ENCOUNTER — Ambulatory Visit: Admitting: Family Medicine

## 2024-03-15 VITALS — BP 126/80 | HR 91 | Resp 16 | Ht 65.0 in | Wt 264.2 lb

## 2024-03-15 DIAGNOSIS — Z23 Encounter for immunization: Secondary | ICD-10-CM

## 2024-03-15 DIAGNOSIS — E1169 Type 2 diabetes mellitus with other specified complication: Secondary | ICD-10-CM

## 2024-03-15 DIAGNOSIS — E785 Hyperlipidemia, unspecified: Secondary | ICD-10-CM | POA: Diagnosis not present

## 2024-03-15 DIAGNOSIS — Z7984 Long term (current) use of oral hypoglycemic drugs: Secondary | ICD-10-CM | POA: Diagnosis not present

## 2024-03-15 DIAGNOSIS — I10 Essential (primary) hypertension: Secondary | ICD-10-CM | POA: Diagnosis not present

## 2024-03-15 DIAGNOSIS — M722 Plantar fascial fibromatosis: Secondary | ICD-10-CM

## 2024-03-15 DIAGNOSIS — Z1211 Encounter for screening for malignant neoplasm of colon: Secondary | ICD-10-CM

## 2024-03-15 LAB — COMPREHENSIVE METABOLIC PANEL
ALT: 45 U/L — ABNORMAL HIGH (ref 0–35)
AST: 28 U/L (ref 0–37)
Albumin: 4.5 g/dL (ref 3.5–5.2)
Alkaline Phosphatase: 100 U/L (ref 39–117)
BUN: 15 mg/dL (ref 6–23)
CO2: 32 meq/L (ref 19–32)
Calcium: 9.9 mg/dL (ref 8.4–10.5)
Chloride: 100 meq/L (ref 96–112)
Creatinine, Ser: 0.94 mg/dL (ref 0.40–1.20)
GFR: 68.81 mL/min (ref >60.00)
Glucose, Bld: 107 mg/dL — ABNORMAL HIGH (ref 70–99)
Potassium: 4.3 meq/L (ref 3.5–5.1)
Sodium: 139 meq/L (ref 135–145)
Total Bilirubin: 0.8 mg/dL (ref 0.2–1.2)
Total Protein: 7.7 g/dL (ref 6.0–8.3)

## 2024-03-15 LAB — LIPID PANEL
Cholesterol: 122 mg/dL (ref 0–200)
HDL: 41.5 mg/dL (ref >39.00)
LDL Cholesterol: 61 mg/dL (ref 0–99)
NonHDL: 80.76
Total CHOL/HDL Ratio: 3
Triglycerides: 100 mg/dL (ref 0.0–149.0)
VLDL: 20 mg/dL (ref 0.0–40.0)

## 2024-03-15 LAB — POCT GLYCOSYLATED HEMOGLOBIN (HGB A1C): HbA1c, POC (controlled diabetic range): 6.5 % (ref 0.0–7.0)

## 2024-03-15 LAB — MICROALBUMIN / CREATININE URINE RATIO
Creatinine,U: 83 mg/dL
Microalb Creat Ratio: 75.2 mg/g — ABNORMAL HIGH (ref 0.0–30.0)
Microalb, Ur: 6.2 mg/dL — ABNORMAL HIGH (ref 0.0–1.9)

## 2024-03-15 MED ORDER — EMPAGLIFLOZIN 25 MG PO TABS: 25.0000 mg | ORAL_TABLET | Freq: Every day | ORAL | 1 refills | Status: DC

## 2024-03-15 MED ORDER — ATENOLOL 25 MG PO TABS: 25.0000 mg | ORAL_TABLET | Freq: Every day | ORAL | 1 refills | Status: DC

## 2024-03-15 MED ORDER — AMLODIPINE BESYLATE 5 MG PO TABS: 5.0000 mg | ORAL_TABLET | Freq: Every day | ORAL | 3 refills | Status: AC

## 2024-03-15 NOTE — Assessment & Plan Note (Signed)
 Overall BP here today adequately controlled, reporting lower readings at home. Continue amlodipine 5 mg daily and atenolol 25 mg daily as well as low-salt diet. Continue monitoring BP at home. Eye exam is current. Follow-up in 6 months.

## 2024-03-15 NOTE — Patient Instructions (Addendum)
 A few things to remember from today's visit:  Type 2 diabetes mellitus with other specified complication, without long-term current use of insulin (HCC) - Plan: Comprehensive metabolic panel, Microalbumin / creatinine urine ratio, POC HgB A1c, empagliflozin (JARDIANCE) 25 MG TABS tablet  Hyperlipidemia associated with type 2 diabetes mellitus (HCC) - Plan: Comprehensive metabolic panel, Lipid panel  Essential hypertension - Plan: Comprehensive metabolic panel, amLODipine (NORVASC) 5 MG tablet, atenolol (TENORMIN) 25 MG tablet  Colon cancer screening - Plan: Ambulatory referral to Gastroenterology  Plantar fasciitis No changes today.  If you need refills for medications you take chronically, please call your pharmacy. Do not use My Chart to request refills or for acute issues that need immediate attention. If you send a my chart message, it may take a few days to be addressed, specially if I am not in the office.  Please be sure medication list is accurate. If a new problem present, please set up appointment sooner than planned today.

## 2024-03-15 NOTE — Assessment & Plan Note (Signed)
 Problem is well-controlled. Continue Jardiance 25 mg daily. Continue appropriate foot and dental care and periodic eye exam. F/U in 6 months.

## 2024-03-15 NOTE — Assessment & Plan Note (Signed)
 Continue rosuvastatin 5 mg daily and low-fat diet. Last LDL 62 in 11/2022. Further recommendation will be given according to lipid panel result.

## 2024-05-26 ENCOUNTER — Telehealth: Admitting: Family

## 2024-05-26 DIAGNOSIS — H1032 Unspecified acute conjunctivitis, left eye: Secondary | ICD-10-CM

## 2024-05-26 MED ORDER — POLYMYXIN B-TRIMETHOPRIM 10000-0.1 UNIT/ML-% OP SOLN: 1.0000 [drp] | Freq: Four times a day (QID) | OPHTHALMIC | 0 refills | Status: AC

## 2024-05-26 NOTE — Progress Notes (Signed)
 Virtual Visit Consent   Kristine Mcdonald, you are scheduled for a virtual visit with a [facility] provider today. Just as with appointments in the office, your consent must be obtained to participate. Your consent will be active for this visit and any virtual visit you may have with one of our providers in the next 365 days. If you have a MyChart account, a copy of this consent can be sent to you electronically.  As this is a virtual visit, video technology does not allow for your provider to perform a traditional examination. This may limit your provider's ability to fully assess your condition. If your provider identifies any concerns that need to be evaluated in person or the need to arrange testing (such as labs, EKG, etc.), we will make arrangements to do so. Although advances in technology are sophisticated, we cannot ensure that it will always work on either your end or our end. If the connection with a video visit is poor, the visit may have to be switched to a telephone visit. With either a video or telephone visit, we are not always able to ensure that we have a secure connection.  By engaging in this virtual visit, you consent to the provision of healthcare and authorize for your insurance to be billed (if applicable) for the services provided during this visit. Depending on your insurance coverage, you may receive a charge related to this service.  I need to obtain your verbal consent now. Are you willing to proceed with your visit today? Kristine Mcdonald has provided verbal consent on 05/26/2024 for a virtual visit (video or telephone). Tommas Fragmin, FNP  Date: 05/26/2024 12:22 PM   Virtual Visit via Video Note   I, Tommas Fragmin, connected with  Kristine Mcdonald  ([id number], 03-06-1971) on 05/26/24 at 12:30 PM EDT by a video-enabled telemedicine application and verified that I am speaking with the correct person using two identifiers.  Location: Patient: Virtual Visit Location  Patient: Home Provider: Virtual Visit Location Provider: Home Office   I discussed the limitations of evaluation and management by telemedicine and the availability of in person appointments. The patient expressed understanding and agreed to proceed.    History of Present Illness: Kristine Mcdonald is a 53 y.o. who identifies as a female who was assigned female at birth, and is being seen today for left red eye that started yesterday.  HPI: Conjunctivitis  The current episode started yesterday. The onset was sudden. The problem occurs continuously. The problem has been gradually worsening. Associated symptoms include eye itching, eye discharge, eye pain and eye redness. Pertinent negatives include no photophobia, no ear discharge, no ear pain, no headaches, no rhinorrhea and no sore throat. The left eye is affected.    Problems:  Patient Active Problem List   Diagnosis Date Noted   Hypokalemia 01/24/2023   Bilateral lower extremity edema 01/24/2023   Morbid obesity with BMI of 40.0-44.9, adult (HCC) 12/05/2022   Hyperlipidemia associated with type 2 diabetes mellitus (HCC) 08/01/2022   Type 2 diabetes mellitus with other specified complication (HCC) 04/16/2022   Acute conjunctivitis of both eyes 02/26/2019   Essential hypertension 09/30/2014    Allergies:  Allergies  Allergen Reactions   Iohexol      Code: HIVES, Desc: hives/rash/leg & arm pain a few hours after iv contrast administration- pt admitted- benadryl & solu-medrol given- 13-hr pre-meds required for future iv contrast administration, Onset Date: [id number]    Medications:  Current Outpatient Medications:  trimethoprim-polymyxin b (POLYTRIM) ophthalmic solution, Place 1 drop into the left eye every 6 (six) hours., Disp: 10 mL, Rfl: 0   amLODipine  (NORVASC ) 5 MG tablet, Take 1 tablet (5 mg total) by mouth daily. Due for follow up/physical, Disp: 90 tablet, Rfl: 3   atenolol  (TENORMIN ) 25 MG tablet, Take 1 tablet (25 mg total)  by mouth daily., Disp: 90 tablet, Rfl: 1   Blood Glucose Monitoring Suppl (ONE TOUCH ULTRA 2) w/Device KIT, Use to test blood sugars 1-2 times daily., Disp: 1 kit, Rfl: 0   empagliflozin  (JARDIANCE ) 25 MG TABS tablet, Take 1 tablet (25 mg total) by mouth daily. Due for follow up, Disp: 90 tablet, Rfl: 1   glucose blood (ONETOUCH ULTRA) test strip, Use to test blood sugars 1-2 times daily., Disp: 100 each, Rfl: 12   Lancets (ONETOUCH ULTRASOFT) lancets, Use to test blood sugars 1-2 times daily., Disp: 100 each, Rfl: 12   rosuvastatin  (CRESTOR ) 5 MG tablet, Take 1 tablet (5 mg total) by mouth daily., Disp: 90 tablet, Rfl: 3  Observations/Objective: Patient is well-developed, well-nourished in no acute distress.  Resting comfortably  at home.  Head is normocephalic, atraumatic.  No labored breathing.  Speech is clear and coherent with logical content.  Patient is alert and oriented at baseline.  Left eye mildly erythemas   Assessment and Plan: 1. Acute bacterial conjunctivitis of left eye (Primary) - trimethoprim-polymyxin b (POLYTRIM) ophthalmic solution; Place 1 drop into the left eye every 6 (six) hours.  Dispense: 10 mL; Refill: 0  Start polytrim  Keep clean and dry Cool compresses Good hand hygiene  Follow up if symptoms worsen or do not improve   Follow Up Instructions: I discussed the assessment and treatment plan with the patient. The patient was provided an opportunity to ask questions and all were answered. The patient agreed with the plan and demonstrated an understanding of the instructions.  A copy of instructions were sent to the patient via MyChart unless otherwise noted below.     The patient was advised to call back or seek an in-person evaluation if the symptoms worsen or if the condition fails to improve as anticipated.    Tommas Fragmin, FNP

## 2024-07-28 ENCOUNTER — Other Ambulatory Visit: Payer: Self-pay | Admitting: Family Medicine

## 2024-07-28 DIAGNOSIS — E119 Type 2 diabetes mellitus without complications: Secondary | ICD-10-CM

## 2024-09-27 ENCOUNTER — Other Ambulatory Visit: Payer: Self-pay | Admitting: Family Medicine

## 2024-09-27 DIAGNOSIS — E1169 Type 2 diabetes mellitus with other specified complication: Secondary | ICD-10-CM

## 2024-10-23 ENCOUNTER — Encounter: Payer: Self-pay | Admitting: Family Medicine

## 2024-10-24 ENCOUNTER — Other Ambulatory Visit: Payer: Self-pay

## 2024-10-24 ENCOUNTER — Other Ambulatory Visit: Payer: Self-pay | Admitting: Family Medicine

## 2024-10-24 DIAGNOSIS — I10 Essential (primary) hypertension: Secondary | ICD-10-CM

## 2024-10-24 MED ORDER — ATENOLOL 25 MG PO TABS: 25.0000 mg | ORAL_TABLET | Freq: Every day | ORAL | 0 refills | Status: DC
